# Patient Record
Sex: Male | Born: 1960 | Race: White | Hispanic: No | Marital: Married | Smoking: Never smoker
Health system: Southern US, Community
[De-identification: ages and names within clinical notes are randomized; demographics above are authoritative.]

## PROBLEM LIST (undated history)

## (undated) DIAGNOSIS — J189 Pneumonia, unspecified organism: Secondary | ICD-10-CM

## (undated) DIAGNOSIS — B508 Other severe and complicated Plasmodium falciparum malaria: Secondary | ICD-10-CM

## (undated) DIAGNOSIS — E872 Acidosis: Secondary | ICD-10-CM

## (undated) DIAGNOSIS — D696 Thrombocytopenia, unspecified: Secondary | ICD-10-CM

## (undated) DIAGNOSIS — A419 Sepsis, unspecified organism: Secondary | ICD-10-CM

---

## 2002-07-29 ENCOUNTER — Encounter: Payer: Self-pay | Admitting: Pulmonary Disease

## 2002-07-29 ENCOUNTER — Inpatient Hospital Stay (HOSPITAL_COMMUNITY): Admission: EM | Admit: 2002-07-29 | Discharge: 2002-09-24 | Payer: Self-pay | Admitting: Pulmonary Disease

## 2002-07-30 ENCOUNTER — Encounter: Payer: Self-pay | Admitting: Pulmonary Disease

## 2002-07-31 ENCOUNTER — Encounter: Payer: Self-pay | Admitting: Thoracic Surgery

## 2002-07-31 ENCOUNTER — Encounter: Payer: Self-pay | Admitting: Pulmonary Disease

## 2002-08-01 ENCOUNTER — Encounter: Payer: Self-pay | Admitting: Pulmonary Disease

## 2002-08-02 ENCOUNTER — Encounter: Payer: Self-pay | Admitting: Pulmonary Disease

## 2002-08-03 ENCOUNTER — Encounter: Payer: Self-pay | Admitting: Pulmonary Disease

## 2002-08-04 ENCOUNTER — Encounter: Payer: Self-pay | Admitting: Thoracic Surgery

## 2002-08-05 ENCOUNTER — Encounter: Payer: Self-pay | Admitting: Pulmonary Disease

## 2002-08-05 ENCOUNTER — Encounter: Payer: Self-pay | Admitting: Critical Care Medicine

## 2002-08-06 ENCOUNTER — Encounter: Payer: Self-pay | Admitting: Critical Care Medicine

## 2002-08-07 ENCOUNTER — Encounter: Payer: Self-pay | Admitting: Critical Care Medicine

## 2002-08-07 ENCOUNTER — Encounter: Payer: Self-pay | Admitting: Pulmonary Disease

## 2002-08-08 ENCOUNTER — Encounter: Payer: Self-pay | Admitting: Critical Care Medicine

## 2002-08-08 ENCOUNTER — Encounter: Payer: Self-pay | Admitting: Pulmonary Disease

## 2002-08-09 ENCOUNTER — Encounter: Payer: Self-pay | Admitting: Pulmonary Disease

## 2002-08-10 ENCOUNTER — Encounter: Payer: Self-pay | Admitting: Pulmonary Disease

## 2002-08-12 ENCOUNTER — Encounter: Payer: Self-pay | Admitting: Critical Care Medicine

## 2002-08-12 ENCOUNTER — Encounter: Payer: Self-pay | Admitting: Pulmonary Disease

## 2002-08-12 ENCOUNTER — Encounter: Payer: Self-pay | Admitting: Thoracic Surgery

## 2002-08-13 ENCOUNTER — Encounter: Payer: Self-pay | Admitting: Thoracic Surgery

## 2002-08-14 ENCOUNTER — Encounter: Payer: Self-pay | Admitting: Pulmonary Disease

## 2002-08-14 ENCOUNTER — Encounter: Payer: Self-pay | Admitting: Thoracic Surgery

## 2002-08-15 ENCOUNTER — Encounter: Payer: Self-pay | Admitting: Thoracic Surgery

## 2002-08-16 ENCOUNTER — Encounter: Payer: Self-pay | Admitting: Pulmonary Disease

## 2002-08-17 ENCOUNTER — Encounter: Payer: Self-pay | Admitting: Pulmonary Disease

## 2002-08-18 ENCOUNTER — Encounter: Payer: Self-pay | Admitting: Thoracic Surgery

## 2002-08-19 ENCOUNTER — Encounter: Payer: Self-pay | Admitting: Critical Care Medicine

## 2002-08-20 ENCOUNTER — Encounter: Payer: Self-pay | Admitting: Nephrology

## 2002-08-20 ENCOUNTER — Encounter: Payer: Self-pay | Admitting: Pulmonary Disease

## 2002-08-21 ENCOUNTER — Encounter: Payer: Self-pay | Admitting: Pulmonary Disease

## 2002-08-21 ENCOUNTER — Encounter: Payer: Self-pay | Admitting: Infectious Diseases

## 2002-08-22 ENCOUNTER — Encounter: Payer: Self-pay | Admitting: Pulmonary Disease

## 2002-08-23 ENCOUNTER — Encounter: Payer: Self-pay | Admitting: Pulmonary Disease

## 2002-08-24 ENCOUNTER — Encounter: Payer: Self-pay | Admitting: Pulmonary Disease

## 2002-08-25 ENCOUNTER — Encounter: Payer: Self-pay | Admitting: Thoracic Surgery

## 2002-08-26 ENCOUNTER — Encounter: Payer: Self-pay | Admitting: Nephrology

## 2002-08-26 ENCOUNTER — Encounter: Payer: Self-pay | Admitting: Pulmonary Disease

## 2002-08-27 ENCOUNTER — Encounter: Payer: Self-pay | Admitting: Pulmonary Disease

## 2002-08-28 ENCOUNTER — Encounter: Payer: Self-pay | Admitting: Pulmonary Disease

## 2002-08-29 ENCOUNTER — Encounter: Payer: Self-pay | Admitting: Pulmonary Disease

## 2002-08-30 ENCOUNTER — Encounter: Payer: Self-pay | Admitting: Pulmonary Disease

## 2002-08-31 ENCOUNTER — Encounter: Payer: Self-pay | Admitting: Thoracic Surgery

## 2002-09-01 ENCOUNTER — Encounter: Payer: Self-pay | Admitting: Thoracic Surgery

## 2002-09-02 ENCOUNTER — Encounter: Payer: Self-pay | Admitting: Thoracic Surgery

## 2002-09-02 ENCOUNTER — Encounter: Payer: Self-pay | Admitting: Pulmonary Disease

## 2002-09-03 ENCOUNTER — Encounter: Payer: Self-pay | Admitting: Pulmonary Disease

## 2002-09-04 ENCOUNTER — Encounter: Payer: Self-pay | Admitting: Thoracic Surgery

## 2002-09-05 ENCOUNTER — Encounter: Payer: Self-pay | Admitting: Thoracic Surgery

## 2002-09-06 ENCOUNTER — Encounter: Payer: Self-pay | Admitting: Thoracic Surgery

## 2002-09-07 ENCOUNTER — Encounter: Payer: Self-pay | Admitting: Thoracic Surgery

## 2002-09-08 ENCOUNTER — Encounter: Payer: Self-pay | Admitting: Thoracic Surgery

## 2002-09-09 ENCOUNTER — Encounter: Payer: Self-pay | Admitting: Thoracic Surgery

## 2002-09-10 ENCOUNTER — Encounter: Payer: Self-pay | Admitting: Thoracic Surgery

## 2002-09-11 ENCOUNTER — Encounter: Payer: Self-pay | Admitting: Thoracic Surgery

## 2002-09-12 ENCOUNTER — Encounter: Payer: Self-pay | Admitting: Thoracic Surgery

## 2002-09-13 ENCOUNTER — Encounter: Payer: Self-pay | Admitting: Pulmonary Disease

## 2002-09-13 ENCOUNTER — Encounter: Payer: Self-pay | Admitting: Thoracic Surgery

## 2002-09-14 ENCOUNTER — Encounter: Payer: Self-pay | Admitting: Thoracic Surgery

## 2002-09-15 ENCOUNTER — Encounter: Payer: Self-pay | Admitting: Thoracic Surgery

## 2002-09-16 ENCOUNTER — Encounter: Payer: Self-pay | Admitting: Thoracic Surgery

## 2002-09-17 ENCOUNTER — Encounter: Payer: Self-pay | Admitting: Thoracic Surgery

## 2002-09-19 ENCOUNTER — Encounter: Payer: Self-pay | Admitting: Thoracic Surgery

## 2002-09-20 ENCOUNTER — Encounter: Payer: Self-pay | Admitting: Thoracic Surgery

## 2002-09-22 ENCOUNTER — Encounter: Payer: Self-pay | Admitting: Pulmonary Disease

## 2002-09-23 ENCOUNTER — Encounter: Payer: Self-pay | Admitting: Thoracic Surgery

## 2002-09-24 ENCOUNTER — Encounter: Payer: Self-pay | Admitting: Thoracic Surgery

## 2002-10-01 ENCOUNTER — Encounter: Payer: Self-pay | Admitting: Thoracic Surgery

## 2002-10-01 ENCOUNTER — Encounter: Admission: RE | Admit: 2002-10-01 | Discharge: 2002-10-01 | Payer: Self-pay | Admitting: Thoracic Surgery

## 2002-10-18 ENCOUNTER — Encounter: Admission: RE | Admit: 2002-10-18 | Discharge: 2002-10-18 | Payer: Self-pay | Admitting: Thoracic Surgery

## 2002-10-18 ENCOUNTER — Encounter: Payer: Self-pay | Admitting: Thoracic Surgery

## 2002-11-29 ENCOUNTER — Encounter: Payer: Self-pay | Admitting: Thoracic Surgery

## 2002-11-29 ENCOUNTER — Encounter: Admission: RE | Admit: 2002-11-29 | Discharge: 2002-11-29 | Payer: Self-pay | Admitting: Thoracic Surgery

## 2003-04-04 ENCOUNTER — Encounter: Admission: RE | Admit: 2003-04-04 | Discharge: 2003-04-04 | Payer: Self-pay | Admitting: Thoracic Surgery

## 2003-04-04 ENCOUNTER — Encounter: Payer: Self-pay | Admitting: Thoracic Surgery

## 2004-02-02 IMAGING — CT CT HEAD W/O CM
1 series · 16 of 28 positions shown, 20 images · non-contrast
Comparison: none

FINDINGS
CLINICAL DATA: FOLLOW-UP FRONTAL HEMORRHAGE.
CT BRAIN WITHOUT CONTRAST
COMPARISON CT 08/09/02.
THERE IS A HYPERDENSITY IN THE RIGHT ANTERIOR FRONTAL LOBE SIMILAR TO THE PRIOR STUDY.  THERE IS
SURROUNDING LOW DENSITY EDEMA.  THIS IS MOST COMPATIBLE WITH A HEMORRHAGIC CONTUSION.  QUESTION
HISTORY OF TRAUMA.  THERE MAY ALSO BE A SMALL AREA OF HEMORRHAGIC CONTUSION IN THE LEFT FRONTAL
LOBE.  NO OTHER AREAS OF HEMORRHAGE ARE SEEN.  THE VENTRICLES ARE NOT ENLARGED.
IMPRESSION
NO SIGNIFICANT CHANGE RIGHT FRONTAL HEMORRHAGE.  THERE MAY ALSO BE A SMALL AREA OF HEMORRHAGE IN
THE LEFT FRONTAL LOBE.  THE APPEARANCE IS MOST COMPATIBLE WITH HEMORRHAGIC CONTUSIONS, QUESTION
TRAUMA.

[Series 2: head w/o · axial · non-contrast · 0.47mm/px · z∈[+140,+265]mm · 16 of 28 slices shown, 20 images]
[im 2/28  brain]
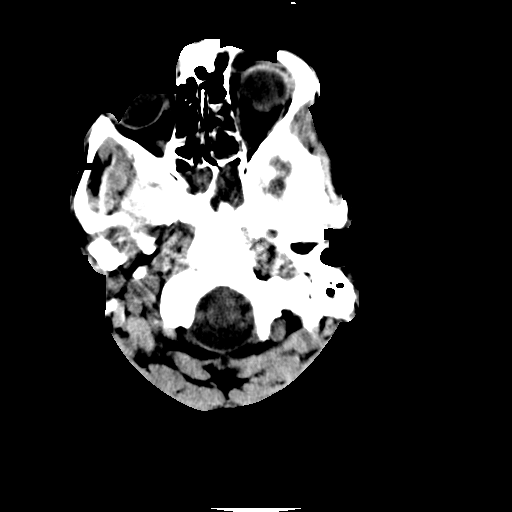
[im 2/28  bone]
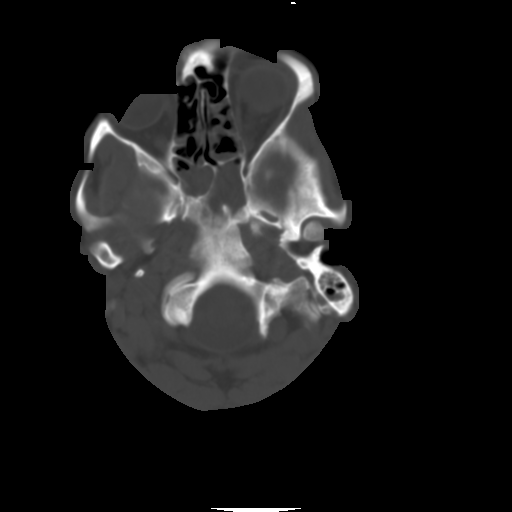
[im 4/28  brain]
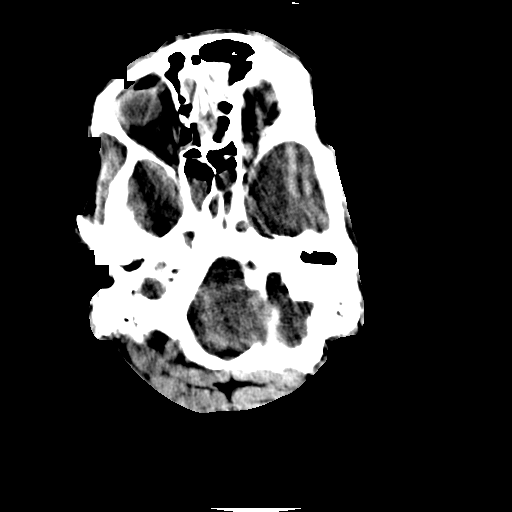
[im 6/28  brain]
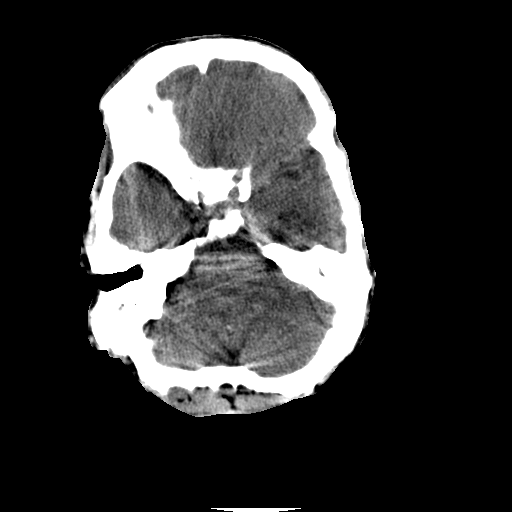
[im 7/28  brain]
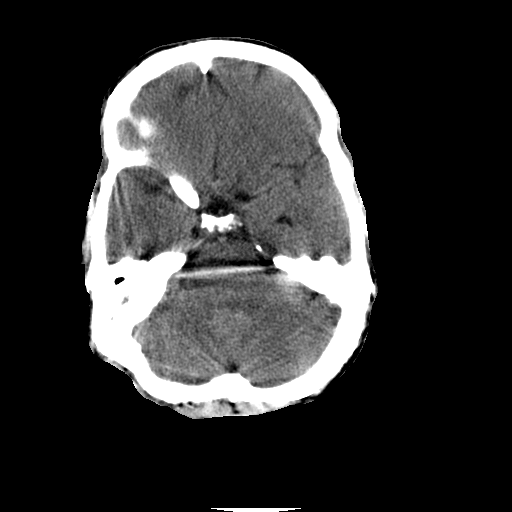
[im 9/28  brain]
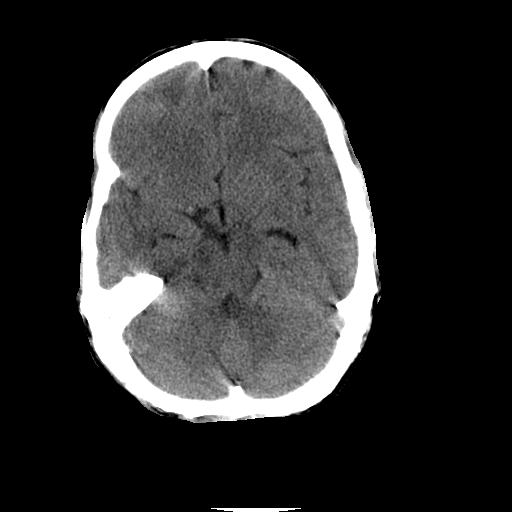
[im 9/28  bone]
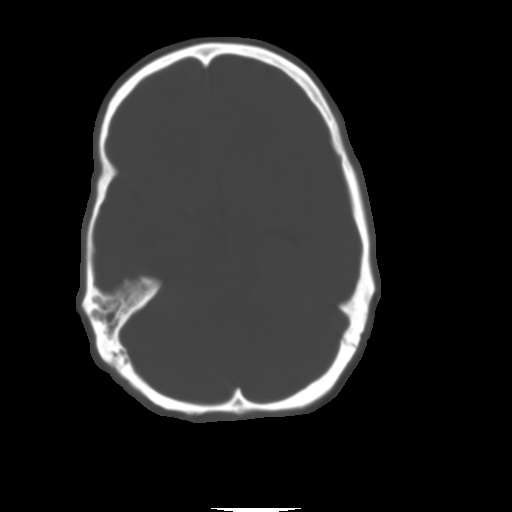
[im 10/28  brain]
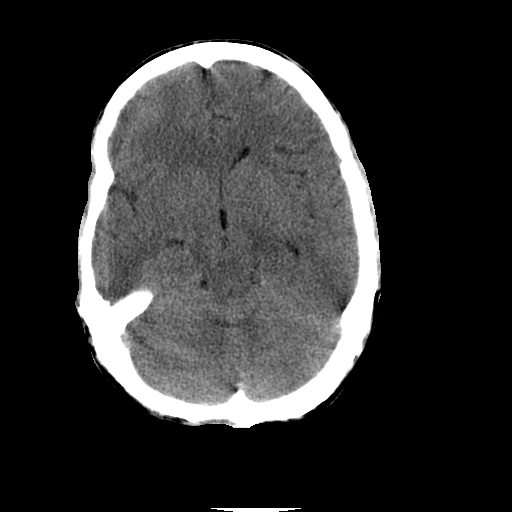
[im 12/28  brain]
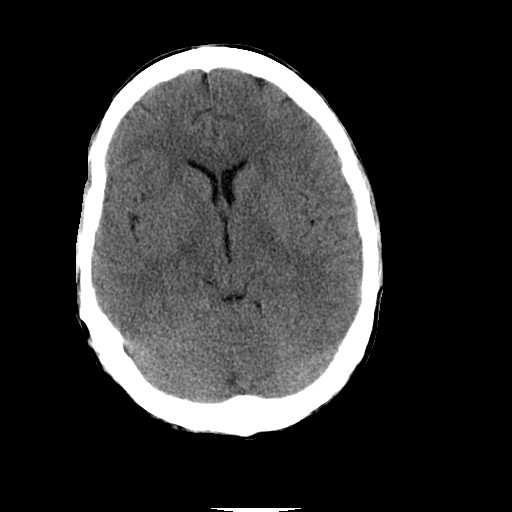
[im 14/28  brain]
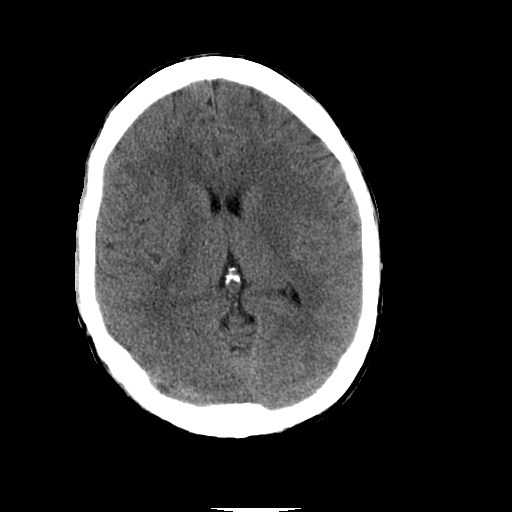
[im 15/28  brain]
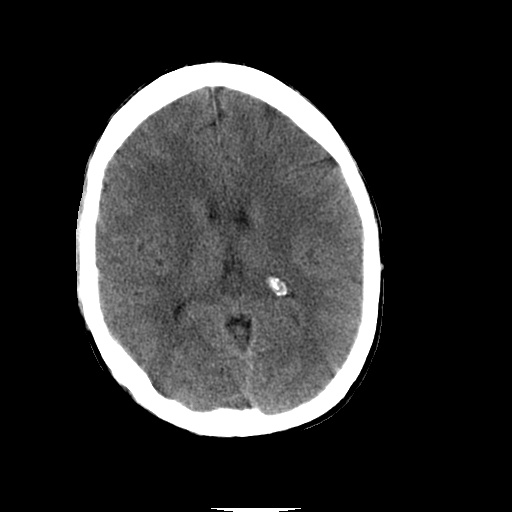
[im 15/28  bone]
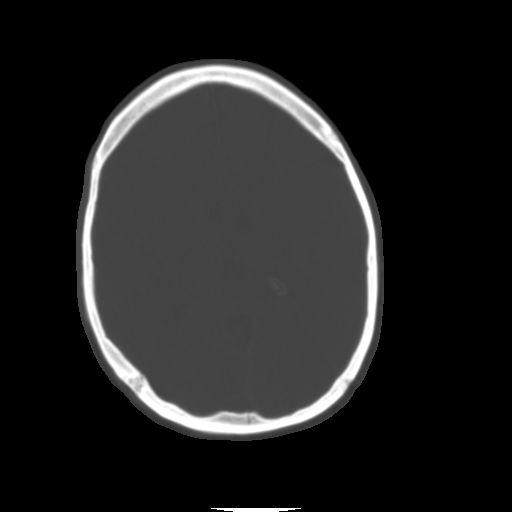
[im 17/28  brain]
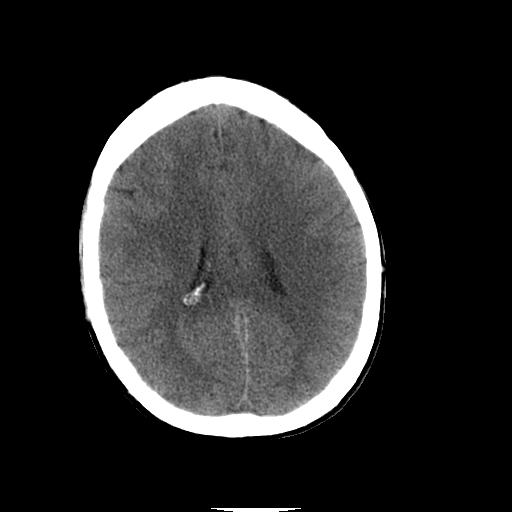
[im 19/28  brain]
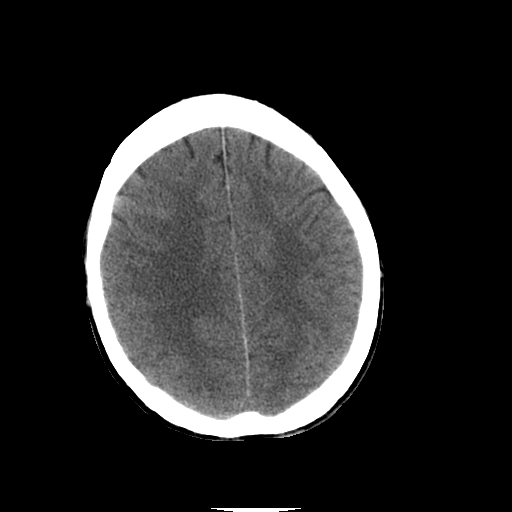
[im 20/28  brain]
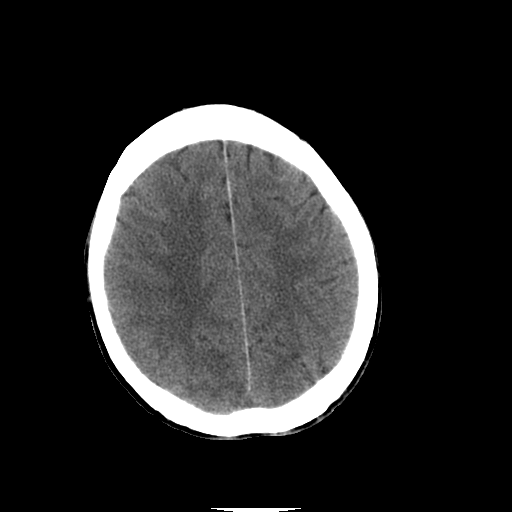
[im 22/28  brain]
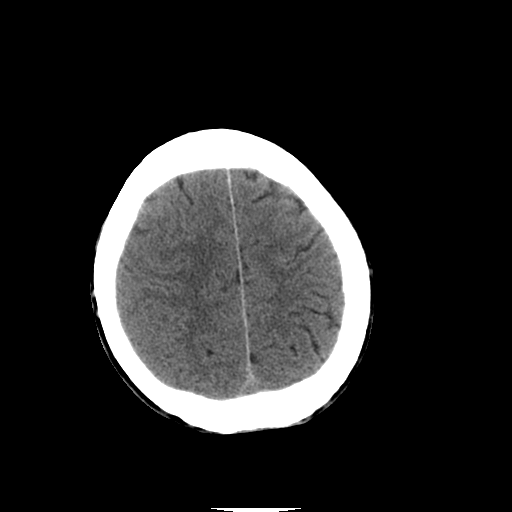
[im 22/28  bone]
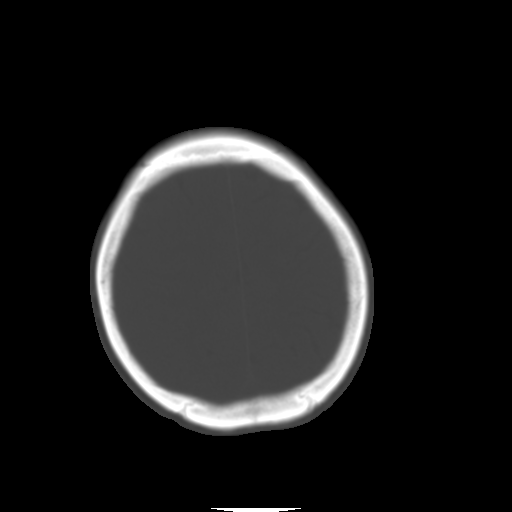
[im 23/28  brain]
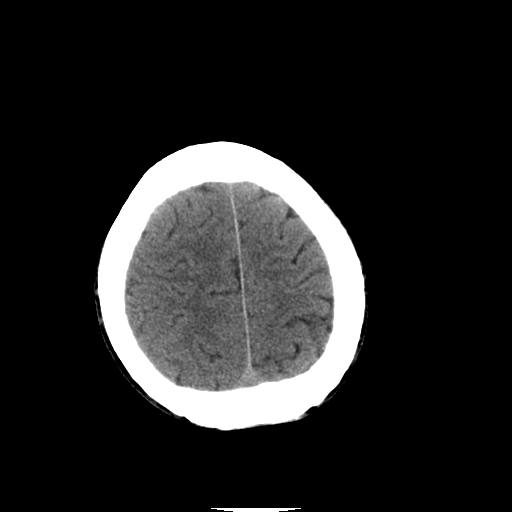
[im 25/28  brain]
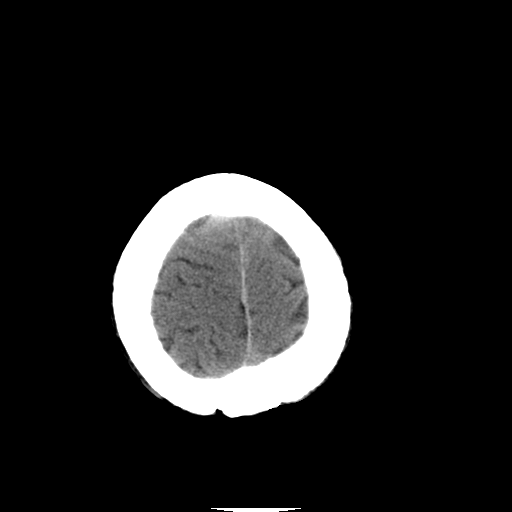
[im 27/28  brain]
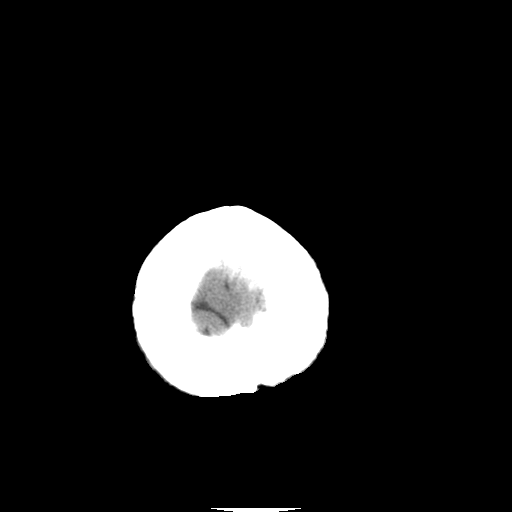

[16 of 28 positions shown; findings below may reference images not displayed]

## 2016-03-18 ENCOUNTER — Emergency Department (HOSPITAL_COMMUNITY): Payer: BC Managed Care – PPO

## 2016-03-18 ENCOUNTER — Inpatient Hospital Stay (HOSPITAL_COMMUNITY)
Admission: EM | Admit: 2016-03-18 | Discharge: 2016-03-24 | DRG: 868 | Disposition: A | Discharge status: Home / Self Care | Payer: BC Managed Care – PPO | Attending: Internal Medicine | Admitting: Internal Medicine

## 2016-03-18 ENCOUNTER — Encounter (HOSPITAL_COMMUNITY): Payer: Self-pay | Admitting: *Deleted

## 2016-03-18 DIAGNOSIS — G94 Other disorders of brain in diseases classified elsewhere: Secondary | ICD-10-CM

## 2016-03-18 DIAGNOSIS — B54 Unspecified malaria: Secondary | ICD-10-CM | POA: Diagnosis present

## 2016-03-18 DIAGNOSIS — R7402 Elevation of levels of lactic acid dehydrogenase (LDH): Secondary | ICD-10-CM | POA: Diagnosis present

## 2016-03-18 DIAGNOSIS — R4701 Aphasia: Secondary | ICD-10-CM | POA: Diagnosis present

## 2016-03-18 DIAGNOSIS — R945 Abnormal results of liver function studies: Secondary | ICD-10-CM

## 2016-03-18 DIAGNOSIS — W19XXXA Unspecified fall, initial encounter: Secondary | ICD-10-CM | POA: Diagnosis present

## 2016-03-18 DIAGNOSIS — R17 Unspecified jaundice: Secondary | ICD-10-CM

## 2016-03-18 DIAGNOSIS — E876 Hypokalemia: Secondary | ICD-10-CM | POA: Diagnosis present

## 2016-03-18 DIAGNOSIS — R7989 Other specified abnormal findings of blood chemistry: Secondary | ICD-10-CM

## 2016-03-18 DIAGNOSIS — E872 Acidosis, unspecified: Secondary | ICD-10-CM

## 2016-03-18 DIAGNOSIS — B509 Plasmodium falciparum malaria, unspecified: Principal | ICD-10-CM | POA: Diagnosis present

## 2016-03-18 DIAGNOSIS — G9340 Encephalopathy, unspecified: Secondary | ICD-10-CM | POA: Diagnosis not present

## 2016-03-18 DIAGNOSIS — R269 Unspecified abnormalities of gait and mobility: Secondary | ICD-10-CM | POA: Diagnosis present

## 2016-03-18 DIAGNOSIS — Y92009 Unspecified place in unspecified non-institutional (private) residence as the place of occurrence of the external cause: Secondary | ICD-10-CM

## 2016-03-18 DIAGNOSIS — R740 Nonspecific elevation of levels of transaminase and lactic acid dehydrogenase [LDH]: Secondary | ICD-10-CM | POA: Diagnosis not present

## 2016-03-18 DIAGNOSIS — D696 Thrombocytopenia, unspecified: Secondary | ICD-10-CM | POA: Diagnosis present

## 2016-03-18 DIAGNOSIS — R739 Hyperglycemia, unspecified: Secondary | ICD-10-CM | POA: Diagnosis present

## 2016-03-18 DIAGNOSIS — R7401 Elevation of levels of liver transaminase levels: Secondary | ICD-10-CM

## 2016-03-18 DIAGNOSIS — B508 Other severe and complicated Plasmodium falciparum malaria: Secondary | ICD-10-CM | POA: Diagnosis not present

## 2016-03-18 DIAGNOSIS — Z789 Other specified health status: Secondary | ICD-10-CM

## 2016-03-18 DIAGNOSIS — B500 Plasmodium falciparum malaria with cerebral complications: Secondary | ICD-10-CM | POA: Diagnosis not present

## 2016-03-18 DIAGNOSIS — E860 Dehydration: Secondary | ICD-10-CM

## 2016-03-18 DIAGNOSIS — N179 Acute kidney failure, unspecified: Secondary | ICD-10-CM | POA: Diagnosis present

## 2016-03-18 HISTORY — DX: Thrombocytopenia, unspecified: D69.6

## 2016-03-18 HISTORY — DX: Acidosis: E87.2

## 2016-03-18 HISTORY — DX: Other severe and complicated Plasmodium falciparum malaria: B50.8

## 2016-03-18 HISTORY — DX: Sepsis, unspecified organism: A41.9

## 2016-03-18 HISTORY — DX: Pneumonia, unspecified organism: J18.9

## 2016-03-18 HISTORY — DX: Acidosis, unspecified: E87.20

## 2016-03-18 LAB — I-STAT CG4 LACTIC ACID, ED
Lactic Acid, Venous: 3.59 mmol/L (ref 0.5–1.9)
Lactic Acid, Venous: 3.36 mmol/L (ref 0.5–1.9)

## 2016-03-18 LAB — COMPREHENSIVE METABOLIC PANEL
ALK PHOS: 62 U/L (ref 38–126)
ALT: 361 U/L — AB (ref 17–63)
ANION GAP: 10 (ref 5–15)
AST: 203 U/L — ABNORMAL HIGH (ref 15–41)
Albumin: 2.9 g/dL — ABNORMAL LOW (ref 3.5–5.0)
BILIRUBIN TOTAL: 6.3 mg/dL — AB (ref 0.3–1.2)
BUN: 24 mg/dL — ABNORMAL HIGH (ref 6–20)
CALCIUM: 8.2 mg/dL — AB (ref 8.9–10.3)
CO2: 27 mmol/L (ref 22–32)
CREATININE: 1.36 mg/dL — AB (ref 0.61–1.24)
Chloride: 99 mmol/L — ABNORMAL LOW (ref 101–111)
GFR, EST NON AFRICAN AMERICAN: 57 mL/min — AB (ref >60)
Glucose, Bld: 124 mg/dL — ABNORMAL HIGH (ref 65–99)
Potassium: 3.4 mmol/L — ABNORMAL LOW (ref 3.5–5.1)
SODIUM: 136 mmol/L (ref 135–145)
TOTAL PROTEIN: 5.2 g/dL — AB (ref 6.5–8.1)

## 2016-03-18 LAB — TYPE AND SCREEN
ABO/RH(D): O POS
Antibody Screen: POSITIVE
DAT, IgG: NEGATIVE
PT AG Type: NEGATIVE

## 2016-03-18 LAB — CBC WITH DIFFERENTIAL/PLATELET
BASOS PCT: 1 %
Basophils Absolute: 0.1 K/uL (ref 0.0–0.1)
Basophils Absolute: 0 10*3/uL (ref 0.0–0.1)
Basophils Relative: 1 %
EOS ABS: 0.4 10*3/uL (ref 0.0–0.7)
Eosinophils Absolute: 0.2 K/uL (ref 0.0–0.7)
Eosinophils Relative: 2 %
Eosinophils Relative: 5 %
HCT: 38.4 % — ABNORMAL LOW (ref 39.0–52.0)
HEMATOCRIT: 32.6 % — AB (ref 39.0–52.0)
Hemoglobin: 13.5 g/dL (ref 13.0–17.0)
Hemoglobin: 11 g/dL — ABNORMAL LOW (ref 13.0–17.0)
LYMPHS ABS: 1.1 10*3/uL (ref 0.7–4.0)
Lymphocytes Relative: 14 %
Lymphocytes Relative: 15 %
Lymphs Abs: 1.1 K/uL (ref 0.7–4.0)
MCH: 30.3 pg (ref 26.0–34.0)
MCH: 29.3 pg (ref 26.0–34.0)
MCHC: 35.2 g/dL (ref 30.0–36.0)
MCHC: 33.7 g/dL (ref 30.0–36.0)
MCV: 86.1 fL (ref 78.0–100.0)
MCV: 86.7 fL (ref 78.0–100.0)
MONO ABS: 0.6 10*3/uL (ref 0.1–1.0)
MONOS PCT: 8 %
Monocytes Absolute: 0.6 K/uL (ref 0.1–1.0)
Monocytes Relative: 8 %
NEUTROS ABS: 5.2 10*3/uL (ref 1.7–7.7)
Neutro Abs: 6 K/uL (ref 1.7–7.7)
Neutrophils Relative %: 75 %
Neutrophils Relative %: 71 %
PLATELETS: 21 10*3/uL — AB (ref 150–400)
Platelets: 28 K/uL — CL (ref 150–400)
RBC: 4.46 MIL/uL (ref 4.22–5.81)
RBC: 3.76 MIL/uL — ABNORMAL LOW (ref 4.22–5.81)
RDW: 13.4 % (ref 11.5–15.5)
RDW: 13.6 % (ref 11.5–15.5)
WBC Morphology: INCREASED
WBC Morphology: INCREASED
WBC: 8 K/uL (ref 4.0–10.5)
WBC: 7.3 10*3/uL (ref 4.0–10.5)

## 2016-03-18 LAB — LACTIC ACID, PLASMA
LACTIC ACID, VENOUS: 3.5 mmol/L — AB (ref 0.5–1.9)
Lactic Acid, Venous: 3.6 mmol/L (ref 0.5–1.9)

## 2016-03-18 LAB — LACTATE DEHYDROGENASE: LDH: 515 U/L — AB (ref 98–192)

## 2016-03-18 LAB — MRSA PCR SCREENING: MRSA by PCR: NEGATIVE

## 2016-03-18 LAB — PARASITE EXAM SCREEN, BLOOD-W CONF TO LABCORP (NOT @ ARMC)

## 2016-03-18 MED ORDER — SODIUM CHLORIDE 0.9 % IV SOLN
24.0000 mg/kg | INTRAVENOUS | Status: AC
  Administered 2016-03-18: 1632 mg via INTRAVENOUS
  Filled 2016-03-18: qty 20.4

## 2016-03-18 MED ORDER — SODIUM CHLORIDE 0.9 % IV BOLUS (SEPSIS)
1000.0000 mL | Freq: Once | INTRAVENOUS | Status: AC
  Administered 2016-03-18: 1000 mL via INTRAVENOUS

## 2016-03-18 MED ORDER — ONDANSETRON HCL 4 MG/2ML IJ SOLN
4.0000 mg | Freq: Three times a day (TID) | INTRAMUSCULAR | Status: DC | PRN
  Administered 2016-03-18: 4 mg via INTRAVENOUS
  Filled 2016-03-18: qty 2

## 2016-03-18 MED ORDER — SODIUM CHLORIDE 0.9 % IV SOLN: Freq: Once | INTRAVENOUS | Status: DC

## 2016-03-18 MED ORDER — POTASSIUM CHLORIDE CRYS ER 20 MEQ PO TBCR
40.0000 meq | EXTENDED_RELEASE_TABLET | Freq: Once | ORAL | Status: AC
  Administered 2016-03-18: 40 meq via ORAL
  Filled 2016-03-18: qty 2

## 2016-03-18 MED ORDER — SODIUM CHLORIDE 0.9 % IV SOLN
12.0000 mg/kg | Freq: Three times a day (TID) | INTRAVENOUS | Status: DC
  Administered 2016-03-18 – 2016-03-20 (×5): 816 mg via INTRAVENOUS
  Filled 2016-03-18 (×10): qty 10.2

## 2016-03-18 MED ORDER — DOXYCYCLINE HYCLATE 100 MG IV SOLR
100.0000 mg | Freq: Once | INTRAVENOUS | Status: AC
  Administered 2016-03-18: 100 mg via INTRAVENOUS
  Filled 2016-03-18: qty 100

## 2016-03-18 MED ORDER — KCL IN DEXTROSE-NACL 20-5-0.45 MEQ/L-%-% IV SOLN
Freq: Once | INTRAVENOUS | Status: AC
  Administered 2016-03-18: 18:00:00 via INTRAVENOUS
  Filled 2016-03-18: qty 1000

## 2016-03-18 NOTE — Consult Note (Signed)
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 Ellender Hose   HPI:  Scott Leonard is an 55 y.o. male with history of prior severe malaria and lung abscess requiring lobectomy by Dr. Arlyce Dice for admission trip in the last few weeks. He states that he did not take malaria prophylaxis and had not done so on any of his prior trips to [place]. He also had been drinking less fluids this time other than feeling slightly dehydrated he felt in his usual state of health until the very end of his trip when he began to have some malaise and anorexia. Apparently one of the clinicians gave him what appears to be in antiparasitic drug related to metronidazole and told him to take this "as needed. He took 1 pill of this before getting on his flight back to the [place]. He continues up with malaise and fatigue and developed fever that then went away he then began to feel better again and then had recurrence of his malaise and profound fatigue and weakness. He would see his primary care physician who drew CBC and a chemistry panel. When the panel came back with liver function tests that were abnormal he was brought back and liver function tests were checked along with a malaria smear. Apparently when the patient was seen by the primary care physician the patient himself thought that he might be suffering from "food poisoning because he had eaten some eggs salads and been feeling poorly afterwards. Note he had not had any vomiting or diarrhea and cyst or was really not all consistent with a foodborne infection.  Yesterday a malaria smear was done and was positive for Plasmodium falciparum. Percent. Her CT me a was 26%.  After feeling better yesterday he slept profoundly last night and was difficult to arouse this morning when he got up to walk he fell down. He was brought to the  emergency department by his family and the diagnosis of malaria conveyed to ED staff. In the ER he was found to have acute kidney injury and lactic acidosis along with severe thrombus cytopenia.  The patient's family had noted him to be having episodes of confusion in the last 24 hours at times forgetting things are commenting about events that did not occur. The patient himself does not seem very cognizant of this but his wife and daughter point out that he clearly was confused at times. During my interview with the patient he was alert and oriented and discussing his trip but was being corrected about information such as the number of people that went with him on his trip he was saying they were 49 where his his wife said no that were 9 dont you re- member?   Past Medical History:  Diagnosis Date  . Pneumonia   . Sepsis ([facility])     History reviewed. No pertinent surgical history.  Social History:  reports that he has never smoked. He has never used smokeless tobacco. He reports that he does not drink alcohol or use drugs.   History reviewed. No pertinent family history.  No Known Allergies   Medications: I have reviewed patients current medications as documented in Epic Anti-infectives    Start     Dose/Rate Route Frequency Ordered Stop   03/18/16 1500  doxycycline (VIBRAMYCIN) 100 mg in dextrose 5 % 250 mL IVPB  100 mg 125 mL/hr over 120 Minutes Intravenous  Once 03/18/16 1430           ROS:  as in HPI otherwise remainder of 12 point Review of Systems is negative    Blood pressure 104/66, pulse (!) 59, temperature 97.8 F (36.6 C), temperature source Oral, resp. rate 16, weight 150 lb (68 kg), SpO2 98 %. General: Alert and awake, oriented x3, not in any acute distress. HEENT: anicteric sclera,  EOMI, oropharynx clear and without exudate Cardiovascular: Bradycardic, normal r,  no murmur rubs or gallops Pulmonary: clear to auscultation bilaterally, no wheezing, rales or  rhonchi Gastrointestinal: soft nontender, nondistended, normal bowel sounds, Musculoskeletal: no   edema noted bilaterally Skin, soft tissue: no rashes Neuro: nonfocal, strength and sensation intact   Results for orders placed or performed during the hospital encounter of 03/18/16 (from the past 48 hour(s))  Type and screen [place]     Status: None   Collection Time: 03/18/16 12:44 PM  Result Value Ref Range   ABO/RH(D) O POS    Antibody Screen POS    Sample Expiration 03/21/2016    DAT, IgG NEG    Antibody Identification ANTI K    PT AG Type NEGATIVE FOR KELL ANTIGEN   CBC with Differential     Status: Abnormal   Collection Time: 03/18/16 12:46 PM  Result Value Ref Range   WBC 8.0 4.0 - 10.5 K/uL   RBC 4.46 4.22 - 5.81 MIL/uL   Hemoglobin 13.5 13.0 - 17.0 g/dL   HCT 38.4 (L) 39.0 - 52.0 %   MCV 86.1 78.0 - 100.0 fL   MCH 30.3 26.0 - 34.0 pg   MCHC 35.2 30.0 - 36.0 g/dL   RDW 13.4 11.5 - 15.5 %   Platelets 28 (LL) 150 - 400 K/uL    Comment: PLATELET COUNT CONFIRMED BY SMEAR CRITICAL RESULT CALLED TO, READ BACK BY AND VERIFIED WITH: ELIZABETH POULOSE,RN AT 1416 03/18/16 BY ZBEECH.    Neutrophils Relative % 75 %   Lymphocytes Relative 14 %   Monocytes Relative 8 %   Eosinophils Relative 2 %   Basophils Relative 1 %   Neutro Abs 6.0 1.7 - 7.7 K/uL   Lymphs Abs 1.1 0.7 - 4.0 K/uL   Monocytes Absolute 0.6 0.1 - 1.0 K/uL   Eosinophils Absolute 0.2 0.0 - 0.7 K/uL   Basophils Absolute 0.1 0.0 - 0.1 K/uL   RBC Morphology BURR CELLS     Comment: MALARIA NOTED ON SMEAR READ BACK AND VERIFIED WITH ELIZABETH POULOSE,RN AT [place].    WBC Morphology INCREASED BANDS (>20% BANDS)     Comment: ATYPICAL LYMPHOCYTES  Comprehensive metabolic panel     Status: Abnormal   Collection Time: 03/18/16 12:46 PM  Result Value Ref Range   Sodium 136 135 - 145 mmol/L   Potassium 3.4 (L) 3.5 - 5.1 mmol/L   Chloride 99 (L) 101 - 111 mmol/L   CO2 27 22 - 32 mmol/L    Glucose, Bld 124 (H) 65 - 99 mg/dL   BUN 24 (H) 6 - 20 mg/dL   Creatinine, Ser 1.36 (H) 0.61 - 1.24 mg/dL   Calcium 8.2 (L) 8.9 - 10.3 mg/dL   Total Protein 5.2 (L) 6.5 - 8.1 g/dL   Albumin 2.9 (L) 3.5 - 5.0 g/dL   AST 203 (H) 15 - 41 U/L   ALT 361 (H) 17 - 63 U/L   Alkaline Phosphatase 62 38 - 126 U/L  Total Bilirubin 6.3 (H) 0.3 - 1.2 mg/dL   GFR calc non Af Amer 57 (L) >60 mL/min   GFR calc Af Amer >60 >60 mL/min    Comment: (NOTE) The eGFR has been calculated using the CKD EPI equation. This calculation has not been validated in all clinical situations. eGFR's persistently <60 mL/min signify possible Chronic Kidney Disease.    Anion gap 10 5 - 15  Lactate dehydrogenase     Status: Abnormal   Collection Time: 03/18/16 12:59 PM  Result Value Ref Range   LDH 515 (H) 98 - 192 U/L  I-Stat CG4 Lactic Acid, ED     Status: Abnormal   Collection Time: 03/18/16  1:13 PM  Result Value Ref Range   Lactic Acid, Venous 3.59 (HH) 0.5 - 1.9 mmol/L   Comment NOTIFIED PHYSICIAN   Parasite Exam Screen, Blood-w conf to [facility] (Not @ [facility])     Status: None   Collection Time: 03/18/16  1:48 PM  Result Value Ref Range   Parasite Exam Screen, Blood            Comment: Plasmodium or other Blood Parasites seen on thin smears. Sent to Reference Laboratory for identification and speciation. CRITICAL RESULT CALLED TO, READ BACK BY AND VERIFIED WITH: E.POULOUS,RN 03/21/16 _0  BY V.WILKINS   I-Stat CG4 Lactic Acid, ED     Status: Abnormal   Collection Time: 03/18/16  4:02 PM  Result Value Ref Range   Lactic Acid, Venous 3.36 (HH) 0.5 - 1.9 mmol/L   Comment NOTIFIED PHYSICIAN    _1 (sdes,specrequest,cult,reptstatus)   )No results found for this or any previous visit (from the past 720 hour(s)).   Impression/Recommendation  Active Problems:   Malaria   MILAM ALLBAUGH is a 55 y.o. male with severe falciparum malaria:  #1 Severe Plasmodium falciparum infection: percent  parasitemia yesterday alone puts him in this category. I am more disturbed than anything by his altered consciousness at times. While these changes might be subtle now they could be a harbinger of impending catastrophic CNS involvement  I discussed case with CDC Malaria team in part because I wanted to see if we could obtain IV artusunate   They were disturbed by his level of parasitemia yesterday and his neuro status  They did not find indication for IV artusunate but recommended IV quinidine and doxycycline which we had ordered  EKG prior to starting therapy with normal QTC and breast T-wave is an the inferior leads  He should have D5 in his maintenance fluid to help prevent hypoglycemia and be monitored closely on telemetry  I ordered a new parasite screen and over this will calm with a percent per CT me at today's it will have a new baseline we'll recheck a smear with percent parasitemia in the morning.  Malaria hotline is [phone]   For now will plan on likely 3 days of IV quinidine and 7 days of doxycyline  Supportive care  Neuro checks   Tele  CDC agreed that he should go to ICU for frequent Neuro checks and close monitoring  #2 Screening; check HIV with am labs.  I spent greater than 80 minutes with the patient including greater than 50% of time in face to face counsel of the patient and his wife and in coordination of their care with ED, CDC and Critical Care    Dr. Megan Salon will check on the patient this weekend.   03/18/2016, 5:09 PM   Thank you so much for this  interesting consult  [place] for [place] [phone] (pager) [phone] (office) 03/18/2016, 5:09 PM  Rhina [place] 03/18/2016, 5:09 PM

## 2016-03-18 NOTE — ED Notes (Signed)
Critical care at bedside  

## 2016-03-18 NOTE — ED Triage Notes (Signed)
Pt just returned home from [place] c/o weakness and no appetite; pt had fever this am and diagnosed with malaria

## 2016-03-18 NOTE — ED Notes (Signed)
Pt to ct 

## 2016-03-18 NOTE — ED Provider Notes (Signed)
[facility]-EMER[facility]ENCY DEPT Provider Note   CSN: [id number] Arrival date & time: 03/18/16  1206  First Provider Contact:  First MD Initiated Contact with Patient 03/18/16 1252        History   Chief Complaint Chief Complaint  Patient presents with  . Other    Malaria     HPI Scott Leonard is a 55 y.o. male.  55 yo M who presents from PCP's office due to concern for malaria. Pt just returned from [place] on Monday. He was there for one month. Two days prior to leaving, pt developed generalized fatigue, weakness, and subjective fevers. He returned home and presented to his PCP. At his PCP visit, he was HDS and was advised hydration. Lab work was sent and was remarkable for positive plasmodium falciparum on smear with 24% parisitosis. Pt was sent here for evaluation. Of note, he endorses and family/friend report increasing confusion, gait instability for last 24 hr. Mild confusion. No focal weakness or numbness. He was febrile yesterday. No vomiting or abdominal pain.   The history is provided by the patient and medical records.    Past Medical History:  Diagnosis Date  . Lactic acidosis 03/18/2016  . Other severe and complicated Plasmodium falciparum malaria 03/18/2016  . Pneumonia   . Sepsis (HCC)   . Thrombocytopenia ([facility]) 03/18/2016    Patient Active Problem List   Diagnosis Date Noted  . Malaria 03/18/2016  . Other severe and complicated Plasmodium falciparum malaria 03/18/2016  . Lactic acidosis 03/18/2016  . Thrombocytopenia (HCC) 03/18/2016    History reviewed. No pertinent surgical history.     Home Medications    Prior to Admission medications   Not on File    Family History History reviewed. No pertinent family history.  Social History Social History  Substance Use Topics  . Smoking status: Never Smoker  . Smokeless tobacco: Never Used  . Alcohol use No     Allergies   Review of patient's allergies indicates no known allergies.   Review of  Systems Review of Systems  Constitutional: Positive for fatigue. Negative for chills and fever.  HENT: Negative for congestion and rhinorrhea.   Eyes: Negative for visual disturbance.  Respiratory: Negative for cough, shortness of breath and wheezing.   Cardiovascular: Negative for chest pain and leg swelling.  Gastrointestinal: Negative for abdominal pain, diarrhea, nausea and vomiting.  Genitourinary: Negative for dysuria and flank pain.  Musculoskeletal: Positive for gait problem. Negative for neck pain and neck stiffness.  Skin: Negative for rash and wound.  Allergic/Immunologic: Negative for immunocompromised state.  Neurological: Positive for headaches. Negative for syncope and weakness.  Psychiatric/Behavioral: Positive for confusion.     Physical Exam Updated Vital Signs BP 98/65   Pulse (!) 56   Temp 98.2 F (36.8 C) (Oral)   Resp 18   Wt 150 lb (68 kg)   SpO2 98%   Physical Exam  Constitutional: He appears well-developed.  Non-toxic but mildly ill-appearing  HENT:  Head: Normocephalic.  Mouth/Throat: No oropharyngeal exudate.  Markedly dry mucous membranes  Eyes: Conjunctivae are normal. Pupils are equal, round, and reactive to light. Scleral icterus is present.  Neck: Neck supple.  Cardiovascular: Normal rate, regular rhythm and normal heart sounds.  Exam reveals no friction rub.   No murmur heard. Pulmonary/Chest: Effort normal. No respiratory distress. He has no wheezes. He has no rales.  Abdominal: Soft. He exhibits no distension. There is no tenderness.  Musculoskeletal: He exhibits no edema.  Skin: Skin  is warm. Capillary refill takes less than 2 seconds.  Nursing note and vitals reviewed.   Neurological Exam:  Mental Status: Alert and oriented to person, place, and time. Attention and concentration normal. Speech clear. Recent memory is intact. Cranial Nerves: Visual fields intact to confrontation in all quadrants bilaterally. EOMI and PERRLA. No  nystagmus noted. Facial sensation intact at forehead, maxillary cheek, and chin/mandible bilaterally. No weakness of masticatory muscles. No facial asymmetry or weakness. Hearing grossly normal to finer rub. Uvula is midline, and palate elevates symmetrically. Normal SCM and trapezius strength. Tongue midline without fasciculations Motor: Muscle strength 5/5 in proximal and distal UE and LE bilaterally. No pronator drift. Muscle tone normal. Reflexes: 2+ and symmetrical in all four extremities.  Sensation: Intact to light touch in upper and lower extremities distally bilaterally.  Gait: Normal without ataxia. Coordination: Normal FTN bilaterally.     ED Treatments / Results  Labs (all labs ordered are listed, but only abnormal results are displayed) Labs Reviewed  CBC WITH DIFFERENTIAL/PLATELET - Abnormal; Notable for the following:       Result Value   HCT 38.4 (*)    Platelets 28 (*)    All other components within normal limits  COMPREHENSIVE METABOLIC PANEL - Abnormal; Notable for the following:    Potassium 3.4 (*)    Chloride 99 (*)    Glucose, Bld 124 (*)    BUN 24 (*)    Creatinine, Ser 1.36 (*)    Calcium 8.2 (*)    Total Protein 5.2 (*)    Albumin 2.9 (*)    AST 203 (*)    ALT 361 (*)    Total Bilirubin 6.3 (*)    GFR calc non Af Amer 57 (*)    All other components within normal limits  LACTATE DEHYDROGENASE - Abnormal; Notable for the following:    LDH 515 (*)    All other components within normal limits  LACTIC ACID, PLASMA - Abnormal; Notable for the following:    Lactic Acid, Venous 3.5 (*)    All other components within normal limits  I-STAT CG4 LACTIC ACID, ED - Abnormal; Notable for the following:    Lactic Acid, Venous 3.59 (*)    All other components within normal limits  I-STAT CG4 LACTIC ACID, ED - Abnormal; Notable for the following:    Lactic Acid, Venous 3.36 (*)    All other components within normal limits  CULTURE, BLOOD (ROUTINE X 2)  CULTURE,  BLOOD (ROUTINE X 2)  MRSA PCR SCREENING  PARASITE EXAM SCREEN, BLOOD-W CONF TO [facility] (NOT @ [facility])  URINALYSIS, ROUTINE W REFLEX MICROSCOPIC (NOT AT [facility])  PARASITE EXAM, BLOOD  LACTIC ACID, PLASMA  CBC WITH DIFFERENTIAL/PLATELET  CBC WITH DIFFERENTIAL/PLATELET  PARASITE EXAM SCREEN, BLOOD-W CONF TO [facility] (NOT @ [facility])  COMPREHENSIVE METABOLIC PANEL  MAGNESIUM  PHOSPHORUS  PROTIME-INR  APTT  LACTATE DEHYDROGENASE  SAVE SMEAR  HIV ANTIBODY (ROUTINE TESTING)  TYPE AND SCREEN    EKG  EKG Interpretation  Date/Time:  Friday March 18 2016 15:06:46 EDT Ventricular Rate:  67 PR Interval:    QRS Duration: 106 QT Interval:  414 QTC Calculation: 437 R Axis:   59 Text Interpretation:  Sinus rhythm Atrial premature complex Probable inferior infarct, old When compared to prior: New T wave inversions in inferior leads Confirmed by Nyesha Cliff MD, Leonel Mccollum [phone]) on 03/18/2016 3:18:48 PM       Radiology Dg Chest 2 View  Result Date: 03/18/2016 CLINICAL DATA:  55 year old  male just returned from [place] with fever and weakness. Suspected malaria. Altered mental status during imaging for this exam. Initial encounter. EXAM: CHEST  2 VIEW COMPARISON:  Report of chest radiographs 04/04/2003 (no images available). FINDINGS: Postoperative changes to the right hemi thorax with chronic rib deformities and suspected chronic pleural scarring along the right hemidiaphragm and in the posterior hemi thorax. Associated mild volume reduction in that lung. Mediastinal contours remain within normal limits. Visualized tracheal air column is within normal limits. No pneumothorax, pulmonary edema, definite pleural effusion, or definite acute pulmonary opacity. There is also a chronic right clavicle deformity. No acute osseous abnormality identified. IMPRESSION: Postoperative changes in the right hemi thorax. No definite acute cardiopulmonary abnormality. Electronically Signed   By: Odessa Fleming M.D.   On: 03/18/2016 14:23    Ct Head Wo Contrast  Result Date: 03/18/2016 CLINICAL DATA:  55 year old male with confusion and gait instability. Initial encounter. EXAM: CT HEAD WITHOUT CONTRAST TECHNIQUE: Contiguous axial images were obtained from the base of the skull through the vertex without intravenous contrast. COMPARISON:  Head CT without contrast 08/10/2002 FINDINGS: Chronic appearing opacification of the visible right maxillary sinus. Resolved bilateral mastoid effusions and other paranasal sinus opacification seen in 2003. No acute osseous abnormality identified. Visualized orbit soft tissues are within normal limits. Visualized scalp soft tissues are within normal limits. Mild Calcified atherosclerosis at the skull base. Cerebral volume remains normal. No midline shift, ventriculomegaly, mass effect, evidence of mass lesion, intracranial hemorrhage or evidence of cortically based acute infarction. Subtle inferior right frontal gyrus encephalomalacia corresponding to an area of cerebral contusion in 2003 (series 2, image 9). Elsewhere Gray-white matter differentiation is within normal limits throughout the brain. No suspicious intracranial vascular hyperdensity. IMPRESSION: 1. No acute intracranial abnormality. Negative noncontrast CT appearance of the brain aside from sequelae of 2003 mild right inferior frontal gyrus cerebral contusion. 2. Chronic right maxillary sinus opacification but other paranasal sinus and mastoid opacification seen in 2003 is resolved. Electronically Signed   By: Odessa Fleming M.D.   On: 03/18/2016 14:12    Procedures .Critical Care Performed by: Shaune Pollack Authorized by: Shaune Pollack   Critical care provider statement:    Critical care time (minutes):  45   Critical care time was exclusive of:  Separately billable procedures and treating other patients   Critical care was necessary to treat or prevent imminent or life-threatening deterioration of the following conditions:  CNS failure or  compromise, dehydration, sepsis and metabolic crisis   Critical care was time spent personally by me on the following activities:  Ordering and performing treatments and interventions, development of treatment plan with patient or surrogate, ordering and review of laboratory studies, discussions with consultants, ordering and review of radiographic studies, pulse oximetry, evaluation of patient's response to treatment, discussions with primary provider, re-evaluation of patient's condition, examination of patient and review of old charts   I assumed direction of critical care for this patient from another provider in my specialty: no      (including critical care time)  Medications Ordered in ED Medications  quiNIDine gluconate 1,632 mg in sodium chloride 0.9 % 250 mL IVPB (1,632 mg Intravenous Given 03/18/16 1551)    Followed by  quiNIDine gluconate 816 mg in sodium chloride 0.9 % 250 mL IVPB (not administered)  sodium chloride 0.9 % bolus 1,000 mL (0 mLs Intravenous Stopped 03/18/16 1448)  sodium chloride 0.9 % bolus 1,000 mL (0 mLs Intravenous Stopped 03/18/16 1550)  doxycycline (VIBRAMYCIN) 100  mg in dextrose 5 % 250 mL IVPB (100 mg Intravenous Transfusing/Transfer 03/18/16 1807)  dextrose 5 % and 0.45 % NaCl with KCl 20 mEq/L infusion ( Intravenous Transfusing/Transfer 03/18/16 1807)  potassium chloride SA (K-DUR,KLOR-CON) CR tablet 40 mEq (40 mEq Oral Given 03/18/16 1620)     Initial Impression / Assessment and Plan / ED Course  I have reviewed the triage vital signs and the nursing notes.  Pertinent labs & imaging results that were available during my care of the patient were reviewed by me and considered in my medical decision making (see chart for details).  Clinical Course   55 yo M with no significant PMHx who p/w positive plasmodium falciparum malaria on recent labwork, with concern for cerebral malaria given confusion, ataxia. Pt dehydrated but non-toxic on exam. Lab work shows marked  hepatitis, mild AKI, and + malaria. LDH and bili elevated c/w significant hemolysis. I discussed case in detail with PCP and Infectious Disease. Consulted ID in ED and discussed with consultant at bedside - CDC contacted, quinidine drip started and doxy per ID. No indication for other ABX per ID. Otherwise, discussed with CDC, ICU and will admit to ICU per CDC guidelines. Pt in agreement. IVF, IV ABX given. Pt re-assessed frequently and >30 min spent discussing with CDC, ID, and consultants. Pt stable but with persistent lactic acidosis.  Final Clinical Impressions(s) / ED Diagnoses   Final diagnoses:  Cerebral malaria  Transaminitis  AKI (acute kidney injury) (HCC)  Lactic acidosis  Dehydration  History of foreign travel  Elevated LDH  Elevated bilirubin    New Prescriptions There are no discharge medications for this patient.    Shaune Pollack, MD 03/18/16 [phone]

## 2016-03-18 NOTE — Progress Notes (Signed)
eLink Physician-Brief Progress Note Patient Name: Scott Leonard DOB: 1961/06/24 MRN: [id number]   Date of Service  03/18/2016  HPI/Events of Note  Nausea and vomiting  eICU Interventions  PRN zofran ordered     Intervention Category Minor Interventions: Routine modifications to care plan (e.g. PRN medications for pain, fever)  Assad Harbeson 03/18/2016, 9:56 PM

## 2016-03-18 NOTE — Progress Notes (Signed)
CRITICAL VALUE ALERT  Critical value received:  Lactic acid 3.6  Date of notification:  03/18/16   Time of notification:  20:12  Critical value read back:Yes.    Nurse who received alert:  Nechama Guard  MD notified (1st page): Dr Henrene Pastor  Time of first page:  20:13  Responding MD:  Dr Henrene Pastor  Time MD responded:  20:13

## 2016-03-18 NOTE — H&P (Signed)
PULMONARY / CRITICAL CARE MEDICINE   Name: Scott Leonard MRN: [id number] DOB: 1961/04/03    ADMISSION DATE:  03/18/2016 CONSULTATION DATE: 8/4  REFERRING MD:  Issacs   CHIEF COMPLAINT:  Malaria   HISTORY OF PRESENT ILLNESS:   This is a pleasant 55 year old male w/ only sig medical h/o PNA and sepsis for which he required prior right lobectomy in past. Goes to [place] yearly on mission and education trip for 1 mo. Did not take any prophylaxis. On the 28th had some sort of egg salad meal which had been out for some time and shortly after that he felt some GI distress, poor appetite and intermittent fever. He attributed these symptoms to food poisoning. He continued to feel poorly over the following days. He returned home on 7/30 and his symptoms of intermittent fever, diarrhea and eventually weakness and dizziness continued to worsen to point he was falling at home. He went to his PCP. A CBC was drawn and smear was positive for malaria. He was sent to the ED for further evaluation. On eval was found to have AKI, mild Lactic acidosis and severe thrombocytopenia. PCCM was asked to admit.   PAST MEDICAL HISTORY :  He  has a past medical history of Pneumonia and Sepsis (HCC).  PAST SURGICAL HISTORY: He  has no past surgical history on file.  No Known Allergies  No current facility-administered medications on file prior to encounter.    No current outpatient prescriptions on file prior to encounter.    FAMILY HISTORY:  His has no family status information on file.    SOCIAL HISTORY: He  reports that he has never smoked. He has never used smokeless tobacco. He reports that he does not drink alcohol or use drugs.  REVIEW OF SYSTEMS:   General: + fever, weakness, poor appetite. HENT: no HA, no sore throat. Pulm: no cough, SOB and chest pain. Card: no CP, no palps. No dizziness. Abd: + diarrhea No abd pain. No nausea or vomiting. Neuro: was dizzy prior to admit and had fallen at home. Now feels  better. Endo: no hot or cold sensitivities. No excess thirst.   SUBJECTIVE:  No distress   VITAL SIGNS: BP 95/65   Pulse 71   Temp 98.8 F (37.1 C) (Oral)   Resp 17   Wt 150 lb (68 kg)   SpO2 97%  Room air HEMODYNAMICS:    VENTILATOR SETTINGS:    INTAKE / OUTPUT: No intake/output data recorded.  PHYSICAL EXAMINATION: General: pleasant 55 year old male, in no acute distress.  Neuro:  Awake, oriented. No focal motor def  HEENT:  NCAT, no JVd  Cardiovascular:  RRR w/out MRG Lungs:  Clear to auscultation. Right posterior thorax w/ prior rib removal.  Abdomen:  Soft, not tender  Musculoskeletal:  Equal st and bulk  Skin:  Warm and dry   LABS:  BMET  Recent Labs Lab 03/18/16 1246  NA 136  K 3.4*  CL 99*  CO2 27  BUN 24*  CREATININE 1.36*  GLUCOSE 124*    Electrolytes  Recent Labs Lab 03/18/16 1246  CALCIUM 8.2*    CBC  Recent Labs Lab 03/18/16 1246  WBC 8.0  HGB 13.5  HCT 38.4*  PLT 28*    Coag's No results for input(s): APTT, INR in the last 168 hours.  Sepsis Markers  Recent Labs Lab 03/18/16 1313  LATICACIDVEN 3.59*    ABG No results for input(s): PHART, PCO2ART, PO2ART in the  last 168 hours.  Liver Enzymes  Recent Labs Lab 03/18/16 1246  AST 203*  ALT 361*  ALKPHOS 62  BILITOT 6.3*  ALBUMIN 2.9*    Cardiac Enzymes No results for input(s): TROPONINI, PROBNP in the last 168 hours.  Glucose No results for input(s): GLUCAP in the last 168 hours.  Imaging Dg Chest 2 View  Result Date: 03/18/2016 CLINICAL DATA:  55 year old male just returned from [place] with fever and weakness. Suspected malaria. Altered mental status during imaging for this exam. Initial encounter. EXAM: CHEST  2 VIEW COMPARISON:  Report of chest radiographs 04/04/2003 (no images available). FINDINGS: Postoperative changes to the right hemi thorax with chronic rib deformities and suspected chronic pleural scarring along the right hemidiaphragm and in the  posterior hemi thorax. Associated mild volume reduction in that lung. Mediastinal contours remain within normal limits. Visualized tracheal air column is within normal limits. No pneumothorax, pulmonary edema, definite pleural effusion, or definite acute pulmonary opacity. There is also a chronic right clavicle deformity. No acute osseous abnormality identified. IMPRESSION: Postoperative changes in the right hemi thorax. No definite acute cardiopulmonary abnormality. Electronically Signed   By: Odessa Fleming M.D.   On: 03/18/2016 14:23   Ct Head Wo Contrast  Result Date: 03/18/2016 CLINICAL DATA:  55 year old male with confusion and gait instability. Initial encounter. EXAM: CT HEAD WITHOUT CONTRAST TECHNIQUE: Contiguous axial images were obtained from the base of the skull through the vertex without intravenous contrast. COMPARISON:  Head CT without contrast 08/10/2002 FINDINGS: Chronic appearing opacification of the visible right maxillary sinus. Resolved bilateral mastoid effusions and other paranasal sinus opacification seen in 2003. No acute osseous abnormality identified. Visualized orbit soft tissues are within normal limits. Visualized scalp soft tissues are within normal limits. Mild Calcified atherosclerosis at the skull base. Cerebral volume remains normal. No midline shift, ventriculomegaly, mass effect, evidence of mass lesion, intracranial hemorrhage or evidence of cortically based acute infarction. Subtle inferior right frontal gyrus encephalomalacia corresponding to an area of cerebral contusion in 2003 (series 2, image 9). Elsewhere Gray-white matter differentiation is within normal limits throughout the brain. No suspicious intracranial vascular hyperdensity. IMPRESSION: 1. No acute intracranial abnormality. Negative noncontrast CT appearance of the brain aside from sequelae of 2003 mild right inferior frontal gyrus cerebral contusion. 2. Chronic right maxillary sinus opacification but other paranasal  sinus and mastoid opacification seen in 2003 is resolved. Electronically Signed   By: Odessa Fleming M.D.   On: 03/18/2016 14:12     STUDIES:  CT head 8/4: 1. No acute intracranial abnormality. Negative noncontrast CT appearance of the brain aside from sequelae of 2003 mild right inferior frontal gyrus cerebral contusion. 2. Chronic right maxillary sinus opacification but other paranasal sinus and mastoid opacification seen in 2003 is resolved.  CULTURES: Parasite blood screen 8/4>>> BCX2 8/4 >>>> UC 8/4>>>  ANTIBIOTICS: Doxy 8/4>>> Quinidine 8/4>>>  SIGNIFICANT EVENTS:   LINES/TUBES:   DISCUSSION: Recently back from [place]. Since 7/30 has had poor po intake and diarrhea. Fever worse. Presented to PCP 8/4 after falling at home. Seen by PCP, CBC obtained-->smear + malaria. Sent to ED, chemistry showed mild AKI and had mild Lactic acidosis. He felt much better after IV fluids. He was seen by ID service who stated ICU admit necessary for monitoring of quinidine side effects and to closely monitor for neurological changes   ASSESSMENT / PLAN:  PULMONARY A: Prior RLL Lobectomy-->no acute issues  P:   Wean O2  CARDIOVASCULAR A:  No acute  P:  Tele  Monitor QTc on quinidine   RENAL A:   AKI Moderate lactic acidosis  Hypokalemia  P:   IV hydration  Repeat LA  Replace K Repeat chemistry in am   GASTROINTESTINAL A:   Nausea and diarrhea  Abnormal LFTs P:   clears Repeat in am   HEMATOLOGIC A:   hemolysis  Severe thrombocytopenia  P:  Trend cbc and LDH Blood smear daily    INFECTIOUS A:   Malaria  Id is following P:   IV Quinidine and Doxy per ID  F/u parasite screen Additional recs per ID   ENDOCRINE A:   Mild hyperglycemia  P:   Ck glucose on am labs   NEUROLOGIC A:   Gait disturbance and fall-->resolved after hydration  CT head negative  P:   Supportive care Neuro checks q 4   Simonne Martinet ACNP-BC [facility]/Critical [facility] Pager  # [phone] OR # [phone] if no answer  03/18/2016, 3:30 PM  STAFF NOTE: I, Rory Percy, MD FACP have personally reviewed patient's available data, including medical history, events of note, physical examination and test results as part of my evaluation. I have discussed with resident/NP and other care providers such as pharmacist, RN and RRT. In addition, I personally evaluated patient and elicited key findings of: awake, fully alert, nonfocal examination, no spleen tip, no petechia, CTA, malaria noted on smear, thrombocytopenia, bili ldh indicative of hemolysis, ID consulted, he was initially hypovolemic but now improved with volume, continue volume, follow renal fxn and liver, ldh, fractionated bili and cbc closely, I think his initial confusion was from hypovolemia and is resolved and NOT indicative of cerebral malaria, wil observe in ICU overnight and likely move out in am if uneventful, monitor QTC closely, I have updated pt in full The patient is critically ill with multiple organ systems failure and requires high complexity decision making for assessment and support, frequent evaluation and titration of therapies, application of advanced monitoring technologies and extensive interpretation of multiple databases.   Critical Care Time devoted to patient care services described in this note is 35 Minutes. This time reflects time of care of this signee: Rory Percy, MD FACP. This critical care time does not reflect procedure time, or teaching time or supervisory time of PA/NP/Med student/Med Resident etc but could involve care discussion time. Rest per NP/medical resident whose note is outlined above and that I agree with   Mcarthur Rossetti. Tyson Alias, MD, FACP Pgr: [phone] [place] [facility] 03/18/2016 4:18 PM

## 2016-03-19 DIAGNOSIS — B508 Other severe and complicated Plasmodium falciparum malaria: Secondary | ICD-10-CM

## 2016-03-19 DIAGNOSIS — D696 Thrombocytopenia, unspecified: Secondary | ICD-10-CM

## 2016-03-19 DIAGNOSIS — G9340 Encephalopathy, unspecified: Secondary | ICD-10-CM

## 2016-03-19 LAB — CBC WITH DIFFERENTIAL/PLATELET
BASOS PCT: 1 %
Basophils Absolute: 0.1 10*3/uL (ref 0.0–0.1)
EOS PCT: 3 %
Eosinophils Absolute: 0.2 10*3/uL (ref 0.0–0.7)
HEMATOCRIT: 33.5 % — AB (ref 39.0–52.0)
HEMOGLOBIN: 11.4 g/dL — AB (ref 13.0–17.0)
Lymphocytes Relative: 12 %
Lymphs Abs: 0.8 10*3/uL (ref 0.7–4.0)
MCH: 29.6 pg (ref 26.0–34.0)
MCHC: 34 g/dL (ref 30.0–36.0)
MCV: 87 fL (ref 78.0–100.0)
MONO ABS: 0.5 10*3/uL (ref 0.1–1.0)
MONOS PCT: 8 %
NEUTROS PCT: 76 %
Neutro Abs: 4.9 10*3/uL (ref 1.7–7.7)
Platelets: 20 10*3/uL — CL (ref 150–400)
RBC: 3.85 MIL/uL — ABNORMAL LOW (ref 4.22–5.81)
RDW: 13.8 % (ref 11.5–15.5)
WBC Morphology: INCREASED
WBC: 6.5 10*3/uL (ref 4.0–10.5)

## 2016-03-19 LAB — PROTIME-INR
INR: 1.24
Prothrombin Time: 15.7 seconds — ABNORMAL HIGH (ref 11.4–15.2)

## 2016-03-19 LAB — PHOSPHORUS: Phosphorus: 1.5 mg/dL — ABNORMAL LOW (ref 2.5–4.6)

## 2016-03-19 LAB — COMPREHENSIVE METABOLIC PANEL
ALBUMIN: 2.4 g/dL — AB (ref 3.5–5.0)
ALK PHOS: 55 U/L (ref 38–126)
ALT: 309 U/L — AB (ref 17–63)
ANION GAP: 10 (ref 5–15)
AST: 216 U/L — AB (ref 15–41)
BILIRUBIN TOTAL: 3.3 mg/dL — AB (ref 0.3–1.2)
BUN: 20 mg/dL (ref 6–20)
CALCIUM: 7.2 mg/dL — AB (ref 8.9–10.3)
CO2: 22 mmol/L (ref 22–32)
CREATININE: 1.24 mg/dL (ref 0.61–1.24)
Chloride: 106 mmol/L (ref 101–111)
GFR calc Af Amer: >60 mL/min (ref >60)
GFR calc non Af Amer: >60 mL/min (ref >60)
GLUCOSE: 128 mg/dL — AB (ref 65–99)
Potassium: 3.5 mmol/L (ref 3.5–5.1)
SODIUM: 138 mmol/L (ref 135–145)
TOTAL PROTEIN: 4.5 g/dL — AB (ref 6.5–8.1)

## 2016-03-19 LAB — SAVE SMEAR

## 2016-03-19 LAB — GLUCOSE, CAPILLARY: Glucose-Capillary: 140 mg/dL — ABNORMAL HIGH (ref 65–99)

## 2016-03-19 LAB — PATHOLOGIST SMEAR REVIEW

## 2016-03-19 LAB — MAGNESIUM: Magnesium: 1.7 mg/dL (ref 1.7–2.4)

## 2016-03-19 LAB — LACTATE DEHYDROGENASE: LDH: 505 U/L — AB (ref 98–192)

## 2016-03-19 LAB — PARASITE EXAM SCREEN, BLOOD-W CONF TO LABCORP (NOT @ ARMC)

## 2016-03-19 LAB — APTT: aPTT: 34 seconds (ref 24–36)

## 2016-03-19 LAB — HIV ANTIBODY (ROUTINE TESTING W REFLEX): HIV SCREEN 4TH GENERATION: NONREACTIVE

## 2016-03-19 MED ORDER — MAGNESIUM SULFATE 2 GM/50ML IV SOLN
2.0000 g | Freq: Once | INTRAVENOUS | Status: AC
  Administered 2016-03-19: 2 g via INTRAVENOUS
  Filled 2016-03-19: qty 50

## 2016-03-19 MED ORDER — ACETAMINOPHEN 325 MG PO TABS
650.0000 mg | ORAL_TABLET | Freq: Four times a day (QID) | ORAL | Status: DC | PRN
  Administered 2016-03-19: 650 mg via ORAL
  Filled 2016-03-19 (×2): qty 2

## 2016-03-19 MED ORDER — DOXYCYCLINE HYCLATE 100 MG IV SOLR
100.0000 mg | Freq: Two times a day (BID) | INTRAVENOUS | Status: DC
  Administered 2016-03-19 (×3): 100 mg via INTRAVENOUS
  Filled 2016-03-19 (×5): qty 100

## 2016-03-19 MED ORDER — PROMETHAZINE HCL 6.25 MG/5ML PO SYRP
12.5000 mg | ORAL_SOLUTION | Freq: Four times a day (QID) | ORAL | Status: DC | PRN
  Filled 2016-03-19: qty 10

## 2016-03-19 MED ORDER — ACETAMINOPHEN 650 MG RE SUPP
650.0000 mg | Freq: Four times a day (QID) | RECTAL | Status: DC | PRN
Start: 2016-03-19 — End: 2016-03-20
  Administered 2016-03-19: 650 mg via RECTAL
  Filled 2016-03-19: qty 1

## 2016-03-19 MED ORDER — POTASSIUM PHOSPHATES 15 MMOLE/5ML IV SOLN
30.0000 mmol | Freq: Once | INTRAVENOUS | Status: AC
  Administered 2016-03-19: 30 mmol via INTRAVENOUS
  Filled 2016-03-19: qty 10

## 2016-03-19 NOTE — Progress Notes (Signed)
Patient has become increasing confused. Upon assessment at 02:00 patient was alert and oriented x4 but was confused about the time of day he stated he "was becoming more confused." Upon assessment at 04:00 he is alert and oriented to self only with dysphasia. Dr. Darrick Penna made aware. No further orders at this time. Will continue to monitor.

## 2016-03-19 NOTE — Progress Notes (Signed)
eLink Physician-Brief Progress Note Patient Name: Scott Leonard DOB: 1961-07-24 MRN: [id number]   Date of Service  03/19/2016  HPI/Events of Note  Hypokalemia, hypomag, hypophos  eICU Interventions  Potassium, mag and phos replaced     Intervention Category Intermediate Interventions: Electrolyte abnormality - evaluation and management  DETERDING,ELIZABETH 03/19/2016, 5:39 AM

## 2016-03-19 NOTE — Progress Notes (Signed)
[place] TEAM 1 - Stepdown/ICU TEAM  Scott Leonard  ZOX:[id number] DOB: [birth year] DOA: 03/18/2016 PCP: No PCP Per Patient    Brief Narrative:  55 year old male w/ h/o PNA and sepsis for which he required right lobectomy who goes to [place] for a month yearly on a mission and education trip. He did not take any prophylaxis. On the 7/28 he felt GI distress, poor appetite and intermittent fever. He attributed these symptoms to food poisoning. He continued to feel poorly over the following days. He returned home on 7/30 and his intermittent fever, diarrhea, weakness, and dizziness continued to worsen to the point he was falling at home. He went to his PCP. A CBC was drawn and smear was positive for malaria. He was sent to the ED for further evaluation where he was found to have AKI, mild lactic acidosis, and severe thrombocytopenia. PCCM was asked to admit, and he was evaluated by ID.     Subjective: Pt was examined by PCCM today.  TRH is assuming care but did not examine today as his acute issues have all been addressed by PCCM for today.    Assessment & Plan:  Severe plasmodium falciparum malaria  IV Quinidine and Doxy per ID   Hemolysis / Severe thrombocytopenia   QTc prolonged on IV Quinidine  437>561 - cont to monitor QTc on quinidine - d/c zofran / avoid all potential QTc prolonging drugs as able    AKI  Moderate lactic acidosis   Hypokalemia   Hypomag  Hypophos   Nausea and diarrhea - Abnormal LFTs  Prior RLL Lobectomy no acute issues   Mild hyperglycemia   DVT prophylaxis: SCDs Code Status: FULL CODE Family Communication: no family present at time of exam  Disposition Plan:   Consultants:  PCCM ID  Procedures: none  Antimicrobials:  Doxycycline 8/4 >  Objective: Blood pressure 112/63, pulse 77, temperature (!) 102.4 F (39.1 C), temperature source Oral, resp. rate (!) 21, weight 67.7 kg (149 lb 4 oz), SpO2 95 %.  Intake/Output Summary (Last 24 hours) at  03/19/16 1002 Last data filed at 03/19/16 0511  Gross per 24 hour  Intake             2870 ml  Output              240 ml  Net             2630 ml   Filed Weights   03/18/16 1433 03/19/16 0500  Weight: 68 kg (150 lb) 67.7 kg (149 lb 4 oz)    Examination: No exam today - no charge   CBC:  Recent Labs Lab 03/18/16 1246 03/18/16 1842 03/19/16 0423  WBC 8.0 7.3 6.5  NEUTROABS 6.0 5.2 4.9  HGB 13.5 11.0* 11.4*  HCT 38.4* 32.6* 33.5*  MCV 86.1 86.7 87.0  PLT 28* 21* 20*   Basic Metabolic Panel:  Recent Labs Lab 03/18/16 1246 03/19/16 0423  NA 136 138  K 3.4* 3.5  CL 99* 106  CO2 27 22  GLUCOSE 124* 128*  BUN 24* 20  CREATININE 1.36* 1.24  CALCIUM 8.2* 7.2*  MG  --  1.7  PHOS  --  1.5*   GFR: CrCl cannot be calculated (Unknown ideal weight.).  Liver Function Tests:  Recent Labs Lab 03/18/16 1246 03/19/16 0423  AST 203* 216*  ALT 361* 309*  ALKPHOS 62 55  BILITOT 6.3* 3.3*  PROT 5.2* 4.5*  ALBUMIN 2.9* 2.4*   Coagulation  Profile:  Recent Labs Lab 03/19/16 0423  INR 1.24    Recent Results (from the past 240 hour(s))  Blood culture (routine x 2)     Status: None (Preliminary result)   Collection Time: 03/18/16  2:50 PM  Result Value Ref Range Status   Specimen Description BLOOD LEFT ANTECUBITAL  Final   Special Requests BOTTLES DRAWN AEROBIC AND ANAEROBIC  10CC  Final   Culture NO GROWTH < 12 HOURS  Final   Report Status PENDING  Incomplete  Blood culture (routine x 2)     Status: None (Preliminary result)   Collection Time: 03/18/16  3:00 PM  Result Value Ref Range Status   Specimen Description BLOOD LEFT HAND  Final   Special Requests BOTTLES DRAWN AEROBIC AND ANAEROBIC  10CC  Final   Culture NO GROWTH < 12 HOURS  Final   Report Status PENDING  Incomplete  MRSA PCR Screening     Status: None   Collection Time: 03/18/16  6:49 PM  Result Value Ref Range Status   MRSA by PCR NEGATIVE NEGATIVE Final    Comment:        The GeneXpert MRSA  Assay (FDA approved for NASAL specimens only), is one component of a comprehensive MRSA colonization surveillance program. It is not intended to diagnose MRSA infection nor to guide or monitor treatment for MRSA infections.      Scheduled Meds: . doxycycline (VIBRAMYCIN) IV  100 mg Intravenous BID  . potassium phosphate IVPB (mmol)  30 mmol Intravenous Once  . quiNIDine (MALARIA_) MAINTENANCE DOSE  12 mg/kg Intravenous Q8H    LOS: 1 day   Lonia Blood, MD [facility] Office  [phone] Pager - Text Page per Amion as per below:  On-Call/Text Page:      [email or web address]      password [email or web address]  If 7PM-7AM, please contact night-coverage [email or web address] Password [email or web address] 03/19/2016, 10:02 AM

## 2016-03-19 NOTE — Progress Notes (Signed)
[facility] ADULT ICU REPLACEMENT PROTOCOL FOR AM LAB REPLACEMENT ONLY  The patient does not apply for the [facility] Adult ICU Electrolyte Replacment Protocol based on the criteria listed below:   1. Is GFR >/= 40 ml/min? Yes.    Patient's GFR today is >60 2. Is urine output >/= 0.5 ml/kg/hr for the last 6 hours? No. Patient's UOP is NONE RECORDED ml/kg/hr 3. Is BUN < 60 mg/dL? Yes.    Patient's BUN today is 20 4. Abnormal electrolyte(s): K+ 3.5 Mg 1.7 Phos 1.5 5. Ordered repletion with: NA 6. If a panic level lab has been reported, has the CCM MD in charge been notified? Yes.  .   Physician:  E Deterding  Cathlean Cower [facility] 03/19/2016 5:33 AM

## 2016-03-19 NOTE — Progress Notes (Signed)
Reported temperature of 103 from nurse tech. Dr. Darrick Penna made aware and Tylenol PO ordered. Patient was unable to swallow the pills. Dr Deterding made aware and suppository order placed. No other orders placed at this time.

## 2016-03-19 NOTE — Progress Notes (Signed)
Patient ID: Scott Leonard, male   DOB: [birth year], 55 y.o.   MRN: [id number]          [facility] for Infectious Disease    Date of Admission:  03/18/2016           Day 1 quinidine and doxycycline  Principal Problem:   Other severe and complicated Plasmodium falciparum malaria Active Problems:   Lactic acidosis   Thrombocytopenia (HCC)   . doxycycline (VIBRAMYCIN) IV  100 mg Intravenous BID  . quiNIDine (MALARIA_) MAINTENANCE DOSE  12 mg/kg Intravenous Q8H   Review of Systems: Review of Systems  Unable to perform ROS: Language    Past Medical History:  Diagnosis Date  . Lactic acidosis 03/18/2016  . Other severe and complicated Plasmodium falciparum malaria 03/18/2016  . Pneumonia   . Sepsis (HCC)   . Thrombocytopenia (HCC) 03/18/2016    Social History  Substance Use Topics  . Smoking status: Never Smoker  . Smokeless tobacco: Never Used  . Alcohol use No    History reviewed. No pertinent family history. No Known Allergies  OBJECTIVE: Vitals:   03/19/16 0900 03/19/16 1000 03/19/16 1100 03/19/16 1200  BP: (!) 109/59 (!) 107/58 (!) 116/59 113/63  Pulse: 71 68 72 74  Resp: 16 (!) 26 (!) 23 (!) 23  Temp:      TempSrc:      SpO2: 96% 95% 96% 94%  Weight:      Height:       Body mass index is 22.04 kg/m.  Physical Exam  Constitutional:  He is alert and in no distress resting in bed. His wife is at the bedside.  Eyes:  Slight conjunctival redness bilaterally.  Neck: Neck supple.  Cardiovascular: Normal rate and regular rhythm.   No murmur heard. Pulmonary/Chest: Effort normal and breath sounds normal. He has no wheezes. He exhibits no tenderness.  Abdominal: Soft. There is no tenderness.  Musculoskeletal: Normal range of motion. He exhibits no edema or tenderness.  Neurological: He is alert.  He appears to have expressive aphasia. Both his wife and his nurse date that the longer they engage him in conversation the better he is able to answer questions. He  is able to state his wedding anniversary and he recognized a friend and called him by name earlier this morning.  Skin: No rash noted.    Lab Results Lab Results  Component Value Date   WBC 6.5 03/19/2016   HGB 11.4 (L) 03/19/2016   HCT 33.5 (L) 03/19/2016   MCV 87.0 03/19/2016   PLT 20 (LL) 03/19/2016    Lab Results  Component Value Date   CREATININE 1.24 03/19/2016   BUN 20 03/19/2016   NA 138 03/19/2016   K 3.5 03/19/2016   CL 106 03/19/2016   CO2 22 03/19/2016    Lab Results  Component Value Date   ALT 309 (H) 03/19/2016   AST 216 (H) 03/19/2016   ALKPHOS 55 03/19/2016   BILITOT 3.3 (H) 03/19/2016     Microbiology: Recent Results (from the past 240 hour(s))  Blood culture (routine x 2)     Status: None (Preliminary result)   Collection Time: 03/18/16  2:50 PM  Result Value Ref Range Status   Specimen Description BLOOD LEFT ANTECUBITAL  Final   Special Requests BOTTLES DRAWN AEROBIC AND ANAEROBIC  10CC  Final   Culture NO GROWTH < 12 HOURS  Final   Report Status PENDING  Incomplete  Blood culture (routine x 2)  Status: None (Preliminary result)   Collection Time: 03/18/16  3:00 PM  Result Value Ref Range Status   Specimen Description BLOOD LEFT HAND  Final   Special Requests BOTTLES DRAWN AEROBIC AND ANAEROBIC  10CC  Final   Culture NO GROWTH < 12 HOURS  Final   Report Status PENDING  Incomplete  MRSA PCR Screening     Status: None   Collection Time: 03/18/16  6:49 PM  Result Value Ref Range Status   MRSA by PCR NEGATIVE NEGATIVE Final    Comment:        The GeneXpert MRSA Assay (FDA approved for NASAL specimens only), is one component of a comprehensive MRSA colonization surveillance program. It is not intended to diagnose MRSA infection nor to guide or monitor treatment for MRSA infections.      ASSESSMENT: He has severe malaria. 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 He  had a very high level parasitemia (26%) yesterday. His malaria smear was repeated this morning and still positive. I called the [facility] and there tach who reads the smears to determine the percent parasitemia is not working on weekends so we will not have any information from his smears to track his progress. I do not think a repeat brain scan is needed at this time. He has only been on therapy for 24 hours and I am still quite hopeful he will recover. There is no evidence so far of any other infection or other reasons for his aphasia.  PLAN: 1. Continue quinidine and doxycycline  Cliffton Asters, MD [facility] Infect[facility] [phone] pager   [phone] cell 03/19/2016, 2:06 PM

## 2016-03-19 NOTE — Progress Notes (Signed)
PULMONARY / CRITICAL CARE MEDICINE   Name: Scott Leonard MRN: [id number] DOB: 1961-05-15    ADMISSION DATE:  03/18/2016 CONSULTATION DATE: 8/4  REFERRING MD:  Issacs   CHIEF COMPLAINT:  Malaria   HISTORY OF PRESENT ILLNESS:   This is a pleasant 55 year old male w/ only sig medical h/o PNA and sepsis for which he required prior right lobectomy in past. Goes to [place] yearly on mission and education trip for 1 mo. Did not take any prophylaxis. On the 28th had some sort of egg salad meal which had been out for some time and shortly after that he felt some GI distress, poor appetite and intermittent fever. He attributed these symptoms to food poisoning. He continued to feel poorly over the following days. He returned home on 7/30 and his symptoms of intermittent fever, diarrhea and eventually weakness and dizziness continued to worsen to point he was falling at home. He went to his PCP. A CBC was drawn and smear was positive for malaria. He was sent to the ED for further evaluation. On eval was found to have AKI, mild Lactic acidosis and severe thrombocytopenia. PCCM was asked to admit.   SUBJECTIVE:  Fever Tmax 103 overnight  Intermittent nausea, no vomiting .  Following commands, oriented x3      VITAL SIGNS: BP 112/63   Pulse 77   Temp 99.3 F (37.4 C) (Oral)   Resp (!) 21   Wt 67.7 kg (149 lb 4 oz)   SpO2 95%  Room air HEMODYNAMICS:    VENTILATOR SETTINGS:    INTAKE / OUTPUT: I/O last 3 completed shifts: In: 2870 [P.O.:120; I.V.:2000; IV Piggyback:750] Out: 240 [Urine:240]  PHYSICAL EXAMINATION: General: pleasant 55 year old male, in no acute distress.  Neuro:  Awake, oriented. No focal motor def  HEENT:  NCAT, no JVd  Cardiovascular:  RRR w/out MRG Lungs:  Clear to auscultation.  Abdomen:  Soft, not tender  Musculoskeletal:  Equal st and bulk  Skin:  Warm and dry   LABS:  BMET  Recent Labs Lab 03/18/16 1246 03/19/16 0423  NA 136 138  K 3.4* 3.5  CL 99* 106   CO2 27 22  BUN 24* 20  CREATININE 1.36* 1.24  GLUCOSE 124* 128*    Electrolytes  Recent Labs Lab 03/18/16 1246 03/19/16 0423  CALCIUM 8.2* 7.2*  MG  --  1.7  PHOS  --  1.5*    CBC  Recent Labs Lab 03/18/16 1246 03/18/16 1842 03/19/16 0423  WBC 8.0 7.3 6.5  HGB 13.5 11.0* 11.4*  HCT 38.4* 32.6* 33.5*  PLT 28* 21* 20*    Coag's  Recent Labs Lab 03/19/16 0423  APTT 34  INR 1.24    Sepsis Markers  Recent Labs Lab 03/18/16 1602 03/18/16 1634 03/18/16 1842  LATICACIDVEN 3.36* 3.5* 3.6*    ABG No results for input(s): PHART, PCO2ART, PO2ART in the last 168 hours.  Liver Enzymes  Recent Labs Lab 03/18/16 1246 03/19/16 0423  AST 203* 216*  ALT 361* 309*  ALKPHOS 62 55  BILITOT 6.3* 3.3*  ALBUMIN 2.9* 2.4*    Cardiac Enzymes No results for input(s): TROPONINI, PROBNP in the last 168 hours.  Glucose No results for input(s): GLUCAP in the last 168 hours.  Imaging Dg Chest 2 View  Result Date: 03/18/2016 CLINICAL DATA:  55 year old male just returned from [place] with fever and weakness. Suspected malaria. Altered mental status during imaging for this exam. Initial encounter. EXAM: CHEST  2 VIEW COMPARISON:  Report of chest radiographs 04/04/2003 (no images available). FINDINGS: Postoperative changes to the right hemi thorax with chronic rib deformities and suspected chronic pleural scarring along the right hemidiaphragm and in the posterior hemi thorax. Associated mild volume reduction in that lung. Mediastinal contours remain within normal limits. Visualized tracheal air column is within normal limits. No pneumothorax, pulmonary edema, definite pleural effusion, or definite acute pulmonary opacity. There is also a chronic right clavicle deformity. No acute osseous abnormality identified. IMPRESSION: Postoperative changes in the right hemi thorax. No definite acute cardiopulmonary abnormality. Electronically Signed   By: Odessa Fleming M.D.   On: 03/18/2016  14:23   Ct Head Wo Contrast  Result Date: 03/18/2016 CLINICAL DATA:  55 year old male with confusion and gait instability. Initial encounter. EXAM: CT HEAD WITHOUT CONTRAST TECHNIQUE: Contiguous axial images were obtained from the base of the skull through the vertex without intravenous contrast. COMPARISON:  Head CT without contrast 08/10/2002 FINDINGS: Chronic appearing opacification of the visible right maxillary sinus. Resolved bilateral mastoid effusions and other paranasal sinus opacification seen in 2003. No acute osseous abnormality identified. Visualized orbit soft tissues are within normal limits. Visualized scalp soft tissues are within normal limits. Mild Calcified atherosclerosis at the skull base. Cerebral volume remains normal. No midline shift, ventriculomegaly, mass effect, evidence of mass lesion, intracranial hemorrhage or evidence of cortically based acute infarction. Subtle inferior right frontal gyrus encephalomalacia corresponding to an area of cerebral contusion in 2003 (series 2, image 9). Elsewhere Gray-white matter differentiation is within normal limits throughout the brain. No suspicious intracranial vascular hyperdensity. IMPRESSION: 1. No acute intracranial abnormality. Negative noncontrast CT appearance of the brain aside from sequelae of 2003 mild right inferior frontal gyrus cerebral contusion. 2. Chronic right maxillary sinus opacification but other paranasal sinus and mastoid opacification seen in 2003 is resolved. Electronically Signed   By: Odessa Fleming M.D.   On: 03/18/2016 14:12     STUDIES:  CT head 8/4: 1. No acute intracranial abnormality. Negative noncontrast CT appearance of the brain aside from sequelae of 2003 mild right inferior frontal gyrus cerebral contusion. 2. Chronic right maxillary sinus opacification but other paranasal sinus and mastoid opacification seen in 2003 is resolved.  CULTURES: Parasite blood screen 8/4>>> BCX2 8/4 >>>> UC  8/4>>>  ANTIBIOTICS: Doxy 8/4>>> Quinidine 8/4>>>  SIGNIFICANT EVENTS:   LINES/TUBES:   DISCUSSION: Recently back from [place]. Since 7/30 has had poor po intake and diarrhea. Fever worse. Presented to PCP 8/4 after falling at home. Seen by PCP, CBC obtained-->smear + malaria. Sent to ED, chemistry showed mild AKI and had mild Lactic acidosis. He felt much better after IV fluids. He was seen by ID service who stated ICU admit necessary for monitoring of quinidine side effects and to closely monitor for neurological changes   ASSESSMENT / PLAN:  PULMONARY A: Prior RLL Lobectomy-->no acute issues  P:   Wean O2  CARDIOVASCULAR A:  QTc prolonged >437>561 P:  Tele  Monitor QTc on quinidine  D/c zofran   RENAL A:   AKI Moderate lactic acidosis  Hypokalemia  Hypomag Hypophos  P:   IV hydration  Tr  LA   K, Mg , Phos  replaced  Repeat chemistry in am   GASTROINTESTINAL A:   Nausea and diarrhea  Abnormal LFTs P:   clears Repeat in am   HEMATOLOGIC A:   hemolysis  Severe thrombocytopenia  P:  Trend cbc and LDH Blood smear daily    INFECTIOUS A:  Malaria  Id is following P:   IV Quinidine and Doxy per ID  F/u parasite scree Additional recs per ID   ENDOCRINE A:   Mild hyperglycemia  P:   Ck glucose on am labs   NEUROLOGIC A:   Gait disturbance and fall-->resolved after hydration  CT head negative  P:   Supportive care Neuro checks q 4  Zyquan Crotty NP-C  [facility]  [phone]   03/19/2016, 8:00 AM

## 2016-03-20 LAB — COMPREHENSIVE METABOLIC PANEL
ALBUMIN: 2.1 g/dL — AB (ref 3.5–5.0)
ALT: 371 U/L — ABNORMAL HIGH (ref 17–63)
ANION GAP: 9 (ref 5–15)
AST: 346 U/L — ABNORMAL HIGH (ref 15–41)
Alkaline Phosphatase: 47 U/L (ref 38–126)
BILIRUBIN TOTAL: 2.6 mg/dL — AB (ref 0.3–1.2)
BUN: 28 mg/dL — ABNORMAL HIGH (ref 6–20)
CALCIUM: 7 mg/dL — AB (ref 8.9–10.3)
CO2: 24 mmol/L (ref 22–32)
Chloride: 103 mmol/L (ref 101–111)
Creatinine, Ser: 1.69 mg/dL — ABNORMAL HIGH (ref 0.61–1.24)
GFR, EST AFRICAN AMERICAN: 51 mL/min — AB (ref >60)
GFR, EST NON AFRICAN AMERICAN: 44 mL/min — AB (ref >60)
Glucose, Bld: 117 mg/dL — ABNORMAL HIGH (ref 65–99)
POTASSIUM: 3.5 mmol/L (ref 3.5–5.1)
Sodium: 136 mmol/L (ref 135–145)
TOTAL PROTEIN: 4.1 g/dL — AB (ref 6.5–8.1)

## 2016-03-20 LAB — SAVE SMEAR

## 2016-03-20 LAB — CBC WITH DIFFERENTIAL/PLATELET
Basophils Absolute: 0.1 10*3/uL (ref 0.0–0.1)
Basophils Relative: 1 %
EOS PCT: 6 %
Eosinophils Absolute: 0.4 10*3/uL (ref 0.0–0.7)
HEMATOCRIT: 29.5 % — AB (ref 39.0–52.0)
Hemoglobin: 10.6 g/dL — ABNORMAL LOW (ref 13.0–17.0)
LYMPHS ABS: 0.9 10*3/uL (ref 0.7–4.0)
Lymphocytes Relative: 14 %
MCH: 30.4 pg (ref 26.0–34.0)
MCHC: 35.9 g/dL (ref 30.0–36.0)
MCV: 84.5 fL (ref 78.0–100.0)
MONO ABS: 0.6 10*3/uL (ref 0.1–1.0)
Monocytes Relative: 9 %
NEUTROS ABS: 4.2 10*3/uL (ref 1.7–7.7)
Neutrophils Relative %: 70 %
Platelets: 26 10*3/uL — CL (ref 150–400)
RBC: 3.49 MIL/uL — AB (ref 4.22–5.81)
RDW: 14.6 % (ref 11.5–15.5)
WBC Morphology: INCREASED
WBC: 6.2 10*3/uL (ref 4.0–10.5)

## 2016-03-20 LAB — MAGNESIUM: MAGNESIUM: 2.2 mg/dL (ref 1.7–2.4)

## 2016-03-20 LAB — LACTIC ACID, PLASMA: LACTIC ACID, VENOUS: 2 mmol/L — AB (ref 0.5–1.9)

## 2016-03-20 LAB — LACTATE DEHYDROGENASE: LDH: 760 U/L — AB (ref 98–192)

## 2016-03-20 LAB — PHOSPHORUS: Phosphorus: 2.3 mg/dL — ABNORMAL LOW (ref 2.5–4.6)

## 2016-03-20 MED ORDER — K PHOS MONO-SOD PHOS DI & MONO 155-852-130 MG PO TABS
500.0000 mg | ORAL_TABLET | Freq: Three times a day (TID) | ORAL | Status: AC
  Administered 2016-03-20 – 2016-03-21 (×6): 500 mg via ORAL
  Filled 2016-03-20 (×7): qty 2

## 2016-03-20 MED ORDER — ARTEMETHER-LUMEFANTRINE 20-120 MG PO TABS
4.0000 | ORAL_TABLET | Freq: Once | ORAL | Status: AC
  Administered 2016-03-20: 4 via ORAL
  Filled 2016-03-20: qty 4

## 2016-03-20 MED ORDER — SODIUM CHLORIDE 0.9 % IV SOLN
INTRAVENOUS | Status: DC
  Administered 2016-03-21 – 2016-03-23 (×3): via INTRAVENOUS

## 2016-03-20 MED ORDER — ACETAMINOPHEN 325 MG PO TABS: 325.0000 mg | ORAL_TABLET | Freq: Four times a day (QID) | ORAL | Status: DC | PRN

## 2016-03-20 MED ORDER — ARTEMETHER-LUMEFANTRINE 20-120 MG PO TABS
4.0000 | ORAL_TABLET | Freq: Two times a day (BID) | ORAL | Status: AC
  Administered 2016-03-21 – 2016-03-22 (×4): 4 via ORAL
  Filled 2016-03-20 (×4): qty 4

## 2016-03-20 MED ORDER — ARTEMETHER-LUMEFANTRINE 20-120 MG PO TABS
4.0000 | ORAL_TABLET | Freq: Once | ORAL | Status: AC
  Administered 2016-03-20: 4 via ORAL
  Filled 2016-03-20 (×2): qty 4

## 2016-03-20 NOTE — Progress Notes (Signed)
EKG CRITICAL VALUE     12 lead EKG performed.  Critical value noted  DE DE, RN notified.   Wandalee FerdinandKimberly Leonard, CCT 03/20/2016 8:21 AM

## 2016-03-20 NOTE — Progress Notes (Signed)
TEAM 1 - Stepdown/ICU TEAM  GANESH DEEG  QQV:[id number] DOB: 1961-07-05 DOA: 03/18/2016 PCP: No PCP Per Patient    Brief Narrative:  55 year old male w/ h/o PNA and sepsis for which he required right lobectomy who goes to [place] for a month yearly on a mission and education trip. He did not take any prophylaxis. On the 7/28 he felt GI distress, poor appetite and intermittent fever. He attributed these symptoms to food poisoning. He continued to feel poorly over the following days. He returned home on 7/30 and his intermittent fever, diarrhea, weakness, and dizziness continued to worsen to the point he was falling at home. He went to his PCP. A CBC was drawn and smear was positive for malaria. He was sent to the ED for further evaluation where he was found to have AKI, mild lactic acidosis, and severe thrombocytopenia. PCCM was asked to admit, and he was evaluated by ID.     Subjective: The pt feels he is much improved today, experiencing only rare difficulty w/ word finding.  He denies n/v, sob, or abdom pain.  He continues to have watery stool, but only 2-3 episodes per day.  He is alert and oriented.    Assessment & Plan:  Severe plasmodium falciparum cerebral malaria  IV Quinidine and Doxy per ID - very high level parasitemia at 26% initially - f/u counts pending (not available on weekend) - HIV negative - counseled pt on absolute need to use prophylaxis each and every time he visits endemic areas  Hemolysis / Severe thrombocytopenia  Counts are holding steady for now - cont to monitor   QTc prolonged on IV Quinidine  QTc 600 this morning - cont to monitor QTc on quinidine - d/c zofran / avoid all other potential QTc prolonging drugs as able    AKI crt has not significantly changed - keep hydrated - follow - likley due to erythrocyte sequestration interfering w/ renal microcirculation + myoglobinuria due to hemolysis   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 -  worsening - follow - keep hydrated - avoid other potential hepatotoxins   Moderate lactic acidosis  Slowly improving but not yet normalized - of note the parasites themselves produce lactate  Hypokalemia  Corrected to normal - follow   Hypomag Corrected to normal - follow   Hypophos  Approaching normal - cont to supplement and follow   Prior RLL Lobectomy no acute issues   Mild hyperglycemia  Check A1c - hypoglycemia is usually the bigger concern in severe malaria per my reading   DVT prophylaxis: SCDs Code Status: FULL CODE Family Communication: spoke w/ wife at bedside at length  Disposition Plan: SDU  Consultants:  PCCM ID  Procedures: none  Antimicrobials:  Doxycycline 8/4 > Quinidine 8/4 >  Objective: Blood pressure 91/61, pulse (!) 55, temperature 97.6 F (36.4 C), temperature source Oral, resp. rate 15, height  (1.753 m), weight 70.2 kg (154 lb 12.2 oz), SpO2 94 %.  Intake/Output Summary (Last 24 hours) at 03/20/16 0831 Last data filed at 03/20/16 0511  Gross per 24 hour  Intake           1640.6 ml  Output              950 ml  Net            690.6 ml   Filed Weights   03/18/16 1433 03/19/16 0500 03/20/16 0500  Weight: 68 kg (150 lb) 67.7 kg (149  lb 4 oz) 70.2 kg (154 lb 12.2 oz)    Examination: General: No acute respiratory distress Lungs: Clear to auscultation bilaterally without wheezes or crackles Cardiovascular: Regular rate and rhythm without murmur gallop or rub normal S1 and S2 Abdomen: Nontender, nondistended, soft, bowel sounds positive, no rebound, no ascites, no appreciable mass Extremities: No significant cyanosis, clubbing, or edema bilateral lower extremities  CBC:  Recent Labs Lab 03/18/16 1246 03/18/16 1842 03/19/16 0423 03/19/16 1542 03/20/16 0519  WBC 8.0 7.3 6.5 5.8 6.2  NEUTROABS 6.0 5.2 4.9 4.5 4.2  HGB 13.5 11.0* 11.4* 11.0* 10.6*  HCT 38.4* 32.6* 33.5* 32.0* 29.5*  MCV 86.1 86.7 87.0 86.7 84.5  PLT 28* 21* 20*  16* 26*   Basic Metabolic Panel:  Recent Labs Lab 03/18/16 1246 03/19/16 0423 03/20/16 0519  NA 136 138 136  K 3.4* 3.5 3.5  CL 99* 106 103  CO2 27 22 24   GLUCOSE 124* 128* 117*  BUN 24* 20 28*  CREATININE 1.36* 1.24 1.69*  CALCIUM 8.2* 7.2* 7.0*  MG  --  1.7 2.2  PHOS  --  1.5* 2.3*   GFR: Estimated Creatinine Clearance: 49 mL/min (by C-G formula based on SCr of 1.69 mg/dL).  Liver Function Tests:  Recent Labs Lab 03/18/16 1246 03/19/16 0423 03/20/16 0519  AST 203* 216* 346*  ALT 361* 309* 371*  ALKPHOS 62 55 47  BILITOT 6.3* 3.3* 2.6*  PROT 5.2* 4.5* 4.1*  ALBUMIN 2.9* 2.4* 2.1*   Coagulation Profile:  Recent Labs Lab 03/19/16 0423  INR 1.24    Recent Results (from the past 240 hour(s))  Blood culture (routine x 2)     Status: None (Preliminary result)   Collection Time: 03/18/16  2:50 PM  Result Value Ref Range Status   Specimen Description BLOOD LEFT ANTECUBITAL  Final   Special Requests BOTTLES DRAWN AEROBIC AND ANAEROBIC  10CC  Final   Culture NO GROWTH 1 DAY  Final   Report Status PENDING  Incomplete  Blood culture (routine x 2)     Status: None (Preliminary result)   Collection Time: 03/18/16  3:00 PM  Result Value Ref Range Status   Specimen Description BLOOD LEFT HAND  Final   Special Requests BOTTLES DRAWN AEROBIC AND ANAEROBIC  10CC  Final   Culture NO GROWTH 1 DAY  Final   Report Status PENDING  Incomplete  MRSA PCR Screening     Status: None   Collection Time: 03/18/16  6:49 PM  Result Value Ref Range Status   MRSA by PCR NEGATIVE NEGATIVE Final    Comment:        The GeneXpert MRSA Assay (FDA approved for NASAL specimens only), is one component of a comprehensive MRSA colonization surveillance program. It is not intended to diagnose MRSA infection nor to guide or monitor treatment for MRSA infections.      Scheduled Meds: . doxycycline (VIBRAMYCIN) IV  100 mg Intravenous BID  . quiNIDine (MALARIA_) MAINTENANCE DOSE  12  mg/kg Intravenous Q8H    LOS: 2 days   Lonia BloodJeffrey T. Monet North, MD [facility] Office  [phone] Pager - Text Page per Loretha StaplerAmion as per below:  On-Call/Text Page:      [email or web address]      password [email or web address]  If 7PM-7AM, please contact night-coverage [email or web address] Password [email or web address] 03/20/2016, 8:31 AM

## 2016-03-20 NOTE — Progress Notes (Signed)
Patient ID: Scott SaltsDavid W Cleverly, male   DOB: September 28, 1960, 55 y.o.   MRN: [id number]          [facility]    Date of Admission:  03/18/2016           Day 2 malaria therapy  Principal Problem:   Other severe and complicated Plasmodium falciparum malaria Active Problems:   Lactic acidosis   Thrombocytopenia (HCC)   . artemether-lumefantrine  4 tablet Oral Once  . artemether-lumefantrine  4 tablet Oral Once  . [START ON 03/21/2016] artemether-lumefantrine  4 tablet Oral BID  . phosphorus  500 mg Oral TID    SUBJECTIVE: He tells me that he is feeling much more lucid today. He has not had any further nausea or vomiting. He remains very weak.  Review of Systems: Review of Systems  Constitutional: Positive for malaise/fatigue. Negative for chills, diaphoresis, fever and weight loss.  HENT: Negative for sore throat.   Respiratory: Negative for cough, sputum production and shortness of breath.   Cardiovascular: Negative for chest pain.  Gastrointestinal: Negative for abdominal pain, diarrhea, nausea and vomiting.  Genitourinary: Negative for dysuria and frequency.  Musculoskeletal: Negative for joint pain and myalgias.  Skin: Negative for rash.  Neurological: Positive for weakness. Negative for dizziness, focal weakness and headaches.  Psychiatric/Behavioral: Negative for depression and substance abuse. The patient is not nervous/anxious.     Past Medical History:  Diagnosis Date  . Lactic acidosis 03/18/2016  . Other severe and complicated Plasmodium falciparum malaria 03/18/2016  . Pneumonia   . Sepsis (HCC)   . Thrombocytopenia (HCC) 03/18/2016    Social History  Substance Use Topics  . Smoking status: Never Smoker  . Smokeless tobacco: Never Used  . Alcohol use No    History reviewed. No pertinent family history. No Known Allergies  OBJECTIVE: Vitals:   03/20/16 0800 03/20/16 0900 03/20/16 1000 03/20/16 1220  BP: 93/62 (!) 89/60 (!) 94/59   Pulse:  (!) 55 62 63   Resp: 16 19 19    Temp:    98.9 F (37.2 C)  TempSrc:    Oral  SpO2: 93% 97% 98%   Weight:      Height:       Body mass index is 22.85 kg/m.  Physical Exam  Constitutional: He is oriented to person, place, and time.  He is alert and in no distress. He is sitting up in a chair.  HENT:  Mouth/Throat: No oropharyngeal exudate.  Eyes: Conjunctivae are normal.  Cardiovascular: Normal rate and regular rhythm.   No murmur heard. Pulmonary/Chest: Breath sounds normal.  Abdominal: Soft. He exhibits no mass. There is no tenderness.  Neurological: He is alert and oriented to person, place, and time.  His speech is normal and fluent today.  Skin: No rash noted.  Psychiatric: Mood and affect normal.    Lab Results Lab Results  Component Value Date   WBC 6.2 03/20/2016   HGB 10.6 (L) 03/20/2016   HCT 29.5 (L) 03/20/2016   MCV 84.5 03/20/2016   PLT 26 (LL) 03/20/2016    Lab Results  Component Value Date   CREATININE 1.69 (H) 03/20/2016   BUN 28 (H) 03/20/2016   NA 136 03/20/2016   K 3.5 03/20/2016   CL 103 03/20/2016   CO2 24 03/20/2016    Lab Results  Component Value Date   ALT 371 (H) 03/20/2016   AST 346 (H) 03/20/2016   ALKPHOS 47 03/20/2016   BILITOT 2.6 (H)  03/20/2016     Microbiology: Recent Results (from the past 240 hour(s))  Blood culture (routine x 2)     Status: None (Preliminary result)   Collection Time: 03/18/16  2:50 PM  Result Value Ref Range Status   Specimen Description BLOOD LEFT ANTECUBITAL  Final   Special Requests BOTTLES DRAWN AEROBIC AND ANAEROBIC  10CC  Final   Culture NO GROWTH 1 DAY  Final   Report Status PENDING  Incomplete  Blood culture (routine x 2)     Status: None (Preliminary result)   Collection Time: 03/18/16  3:00 PM  Result Value Ref Range Status   Specimen Description BLOOD LEFT HAND  Final   Special Requests BOTTLES DRAWN AEROBIC AND ANAEROBIC  10CC  Final   Culture NO GROWTH 1 DAY  Final   Report Status  PENDING  Incomplete  MRSA PCR Screening     Status: None   Collection Time: 03/18/16  6:49 PM  Result Value Ref Range Status   MRSA by PCR NEGATIVE NEGATIVE Final    Comment:        The GeneXpert MRSA Assay (FDA approved for NASAL specimens only), is one component of a comprehensive MRSA colonization surveillance program. It is not intended to diagnose MRSA infection nor to guide or monitor treatment for MRSA infections.      ASSESSMENT: He is much better after 2 days of quinidine and doxycycline. His expressive aphasia has resolved and he is now afebrile. I reviewed his peripheral blood smear with Dr. Adolphus Birchwood yesterday. He continues to have severe thrombocytopenia but his parasitemia has improved dramatically. He had parasitemia in 26% of his red blood cells on admission. This was down to 5% yesterday afternoon. He has developed progressive QTc prolongation related to quinidine. I have stopped quinidine and doxycycline and started him on Coartem to complete therapy.  PLAN: 1. Change quinidine and doxycycline to Coartem 2. Repeat blood smear in the a.m. 3. Hydration  Cliffton Asters, MD [facility] [phone] pager   [phone] cell 03/20/2016, 12:37 PM

## 2016-03-21 DIAGNOSIS — N179 Acute kidney failure, unspecified: Secondary | ICD-10-CM | POA: Diagnosis present

## 2016-03-21 DIAGNOSIS — R7401 Elevation of levels of liver transaminase levels: Secondary | ICD-10-CM | POA: Diagnosis present

## 2016-03-21 DIAGNOSIS — R4701 Aphasia: Secondary | ICD-10-CM | POA: Diagnosis present

## 2016-03-21 DIAGNOSIS — R740 Nonspecific elevation of levels of transaminase and lactic acid dehydrogenase [LDH]: Secondary | ICD-10-CM

## 2016-03-21 LAB — URINALYSIS, ROUTINE W REFLEX MICROSCOPIC
GLUCOSE, UA: NEGATIVE mg/dL
KETONES UR: NEGATIVE mg/dL
Nitrite: POSITIVE — AB
PROTEIN: 30 mg/dL — AB
Specific Gravity, Urine: 1.022 (ref 1.005–1.030)
pH: 5.5 (ref 5.0–8.0)

## 2016-03-21 LAB — COMPREHENSIVE METABOLIC PANEL
ALT: 314 U/L — AB (ref 17–63)
AST: 168 U/L — AB (ref 15–41)
Albumin: 2 g/dL — ABNORMAL LOW (ref 3.5–5.0)
Alkaline Phosphatase: 55 U/L (ref 38–126)
Anion gap: 6 (ref 5–15)
BUN: 31 mg/dL — AB (ref 6–20)
CHLORIDE: 107 mmol/L (ref 101–111)
CO2: 24 mmol/L (ref 22–32)
CREATININE: 1.81 mg/dL — AB (ref 0.61–1.24)
Calcium: 7.1 mg/dL — ABNORMAL LOW (ref 8.9–10.3)
GFR calc Af Amer: 47 mL/min — ABNORMAL LOW (ref >60)
GFR, EST NON AFRICAN AMERICAN: 40 mL/min — AB (ref >60)
Glucose, Bld: 103 mg/dL — ABNORMAL HIGH (ref 65–99)
Potassium: 3.4 mmol/L — ABNORMAL LOW (ref 3.5–5.1)
SODIUM: 137 mmol/L (ref 135–145)
Total Bilirubin: 2.2 mg/dL — ABNORMAL HIGH (ref 0.3–1.2)
Total Protein: 4.2 g/dL — ABNORMAL LOW (ref 6.5–8.1)

## 2016-03-21 LAB — LACTIC ACID, PLASMA: Lactic Acid, Venous: 1.9 mmol/L (ref 0.5–1.9)

## 2016-03-21 LAB — CBC
HEMATOCRIT: 32.9 % — AB (ref 39.0–52.0)
HEMOGLOBIN: 11 g/dL — AB (ref 13.0–17.0)
MCH: 28.8 pg (ref 26.0–34.0)
MCHC: 33.4 g/dL (ref 30.0–36.0)
MCV: 86.1 fL (ref 78.0–100.0)
Platelets: 34 10*3/uL — ABNORMAL LOW (ref 150–400)
RBC: 3.82 MIL/uL — AB (ref 4.22–5.81)
RDW: 15 % (ref 11.5–15.5)
WBC: 7.7 10*3/uL (ref 4.0–10.5)

## 2016-03-21 LAB — PARASITE EXAM SCREEN, BLOOD-W CONF TO LABCORP (NOT @ ARMC)

## 2016-03-21 LAB — LACTATE DEHYDROGENASE: LDH: 526 U/L — ABNORMAL HIGH (ref 98–192)

## 2016-03-21 LAB — URINE MICROSCOPIC-ADD ON

## 2016-03-21 LAB — SAVE SMEAR

## 2016-03-21 MED ORDER — POTASSIUM CHLORIDE CRYS ER 20 MEQ PO TBCR
40.0000 meq | EXTENDED_RELEASE_TABLET | Freq: Once | ORAL | Status: AC
  Administered 2016-03-21: 40 meq via ORAL
  Filled 2016-03-21: qty 2

## 2016-03-21 MED ORDER — ENSURE ENLIVE PO LIQD
237.0000 mL | Freq: Two times a day (BID) | ORAL | Status: DC
  Administered 2016-03-21: 237 mL via ORAL

## 2016-03-21 NOTE — Plan of Care (Signed)
Problem: Nutrition: Goal: Adequate nutrition will be maintained Outcome: Progressing Discussed the importance of hydration and eating.  Patient mentioned the ensure supplements were something he could manage when not eating well.

## 2016-03-21 NOTE — Progress Notes (Signed)
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   Antibiotics:  Anti-infectives    Start     Dose/Rate Route Frequency Ordered Stop   03/21/16 0800  artemether-lumefantrine (COARTEM) 20-120 MG tablet 4 tablet     4 tablet Oral 2 times daily 03/20/16 1154 03/23/16 0759   03/20/16 2000  artemether-lumefantrine (COARTEM) 20-120 MG tablet 4 tablet     4 tablet Oral  Once 03/20/16 1154 03/20/16 2136   03/20/16 1200  artemether-lumefantrine (COARTEM) 20-120 MG tablet 4 tablet     4 tablet Oral  Once 03/20/16 1150 03/20/16 1340   03/19/16 0200  doxycycline (VIBRAMYCIN) 100 mg in dextrose 5 % 250 mL IVPB  Status:  Discontinued     100 mg 125 mL/hr over 120 Minutes Intravenous 2 times daily 03/19/16 0020 03/20/16 1056   03/18/16 1500  doxycycline (VIBRAMYCIN) 100 mg in dextrose 5 % 250 mL IVPB     100 mg 125 mL/hr over 120 Minutes Intravenous  Once 03/18/16 1430 03/18/16 1752      Medications: Scheduled Meds: . artemether-lumefantrine  4 tablet Oral BID  . feeding supplement (ENSURE ENLIVE)  237 mL Oral BID BM  . phosphorus  500 mg Oral TID   Continuous Infusions: . sodium chloride 75 mL/hr at 03/20/16 1800   PRN Meds:.acetaminophen, promethazine    Objective: Weight change: 5 lb 4.7 oz (2.4 kg)  Intake/Output Summary (Last 24 hours) at 03/21/16 1753 Last data filed at 03/21/16 1500  Gross per 24 hour  Intake           2817.5 ml  Output              400 ml  Net           2417.5 ml   Blood pressure 112/66, pulse 61, temperature 98.5 F (36.9 C), temperature source Oral, resp. rate 14, height 5\' 9"  (1.753 m), weight 158 lb 4.6 oz (71.8 kg), SpO2 95 %. Temp:  [97.5 F (36.4 C)-99.8 F (37.7 C)] 98.5 F (36.9 C) (08/07 1600) Pulse Rate:  [51-81] 61 (08/07 1600) Resp:  [10-20] 14 (08/07 1600) BP: (80-112)/(51-74) 112/66 (08/07 1600) SpO2:  [93 %-98 %] 95 % (08/07 1600) Weight:  [158 lb 4.6 oz (71.8 kg)-160  lb 0.9 oz (72.6 kg)] 158 lb 4.6 oz (71.8 kg) (08/07 0436)  Physical Exam: General: Alert and awake, oriented x3, not in any acute distress, fatigued HEENT: anicteric sclera, pupils reactive to light and accommodation, EOMI CVS regular rate, normal r,  no murmur rubs or gallops Chest: clear to auscultation bilaterally, no wheezing, rales or rhonchi Abdomen: soft nontender, nondistended, normal bowel sounds, Extremities: no  clubbing or edema noted bilaterally Skin: no rashes Neuro: nonfocal  CBC: CBC Latest Ref Rng & Units 03/21/2016 03/20/2016 03/19/2016  WBC 4.0 - 10.5 K/uL 7.7 6.2 5.8  Hemoglobin 13.0 - 17.0 g/dL 11.0(L) 10.6(L) 11.0(L)  Hematocrit 39.0 - 52.0 % 32.9(L) 29.5(L) 32.0(L)  Platelets 150 - 400 K/uL 34(L) 26(LL) 16(LL)      BMET  Recent Labs  03/20/16 0519 03/21/16 0423  NA 136 137  K 3.5 3.4*  CL 103 107  CO2 24 24  GLUCOSE 117* 103*  BUN 28* 31*  CREATININE 1.69* 1.81*  CALCIUM 7.0* 7.1*     Liver Panel   Recent Labs  03/20/16 0519 03/21/16 0423  PROT 4.1* 4.2*  ALBUMIN 2.1* 2.0*  AST  346* 168*  ALT 371* 314*  ALKPHOS 47 55  BILITOT 2.6* 2.2*       Sedimentation Rate No results for input(s): ESRSEDRATE in the last 72 hours. C-Reactive Protein No results for input(s): CRP in the last 72 hours.  Micro Results: Recent Results (from the past 720 hour(s))  Blood culture (routine x 2)     Status: None (Preliminary result)   Collection Time: 03/18/16  2:50 PM  Result Value Ref Range Status   Specimen Description BLOOD LEFT ANTECUBITAL  Final   Special Requests BOTTLES DRAWN AEROBIC AND ANAEROBIC  10CC  Final   Culture NO GROWTH 3 DAYS  Final   Report Status PENDING  Incomplete  Blood culture (routine x 2)     Status: None (Preliminary result)   Collection Time: 03/18/16  3:00 PM  Result Value Ref Range Status   Specimen Description BLOOD LEFT HAND  Final   Special Requests BOTTLES DRAWN AEROBIC AND ANAEROBIC  10CC  Final   Culture NO  GROWTH 3 DAYS  Final   Report Status PENDING  Incomplete  MRSA PCR Screening     Status: None   Collection Time: 03/18/16  6:49 PM  Result Value Ref Range Status   MRSA by PCR NEGATIVE NEGATIVE Final    Comment:        The GeneXpert MRSA Assay (FDA approved for NASAL specimens only), is one component of a comprehensive MRSA colonization surveillance program. It is not intended to diagnose MRSA infection nor to guide or monitor treatment for MRSA infections.     Studies/Results: No results found.    Assessment/Plan:  INTERVAL HISTORY:    Aphasia resolved   Principal Problem:   Other severe and complicated Plasmodium falciparum malaria Active Problems:   Lactic acidosis   Thrombocytopenia (HCC)    Scott Leonard is a 55 y.o. male with  SEVERE Plasmodium falciparum malaria High-grade parasitemia and likely cerebral involvement with confusion and aphasia both now having resolved. Patient also has acute kidney injury which is still not yet resolved.  #1 Severe Plasmodium falciparum malaria I: Patient's course has been complicated by likely cerebral involvement as well as acute kidney injury as well as quinidine toxicity with QT prolongation. Patient is now off clonidine and doxycycline and is completing a course of co-artem  The formal % parasitemia and ID are not yet back ( I frankly think this turn around is fairly poor esp for ID of an organism fortunately it had already been ID by outside lab from PCP office whose lab has faster turnaround than ours (at least on weekend)  He is fortunately doing well now apart from the AKI  Complete coartem therapy  Continue supportive care  I will return to see him in the am and then will sign off   LOS: 3 days   Acey Lav 03/21/2016, 5:53 PM

## 2016-03-21 NOTE — Progress Notes (Signed)
Initial Nutrition Assessment  DOCUMENTATION CODES:   Not applicable  INTERVENTION:    Ensure Enlive po BID, each supplement provides 350 kcal and 20 grams of protein  NUTRITION DIAGNOSIS:   Increased nutrient needs related to acute illness as evidenced by estimated needs  GOAL:   Patient will meet greater than or equal to 90% of their needs  MONITOR:   PO intake, Supplement acceptance, Labs, Weight trends, I & O's  REASON FOR ASSESSMENT:   Malnutrition Screening Tool  ASSESSMENT:   55 year old Male w/ only sig medical h/o PNA and sepsis for which he required prior right lobectomy in past. [place] mission and education trip for 1 mo. Did not take any prophylaxis. On the 28th had some sort of egg salad meal which had been out for some time and shortly after that he felt some GI distress, poor appetite and intermittent fever. He attributed these symptoms to food poisoning. He continued to feel poorly over the following days. He returned home on 7/30 and his symptoms of intermittent fever, diarrhea and eventually weakness and dizziness continued to worsen to point he was falling at home. He went to his PCP. A CBC was drawn and smear was positive for malaria.  Spoke with pt and pt's wife at bedside. Pt reports he was experiencing a poor appetite PTA along with diarrhea. PO intake still variable per pt's wife >> no % available per flowsheets records. Would benefit from addition of oral nutrition supplements. Amenable to chocolate Ensure Enlive >> will order. No muscle or subcutaneous fat depletion noticed.  Diet Order:  Diet regular Room service appropriate? Yes; Fluid consistency: Thin  Skin:  Reviewed, no issues  Last BM:  8/7  Height:   Ht Readings from Last 1 Encounters:  03/20/16 5\' 9"  (1.753 m)    Weight:   Wt Readings from Last 1 Encounters:  03/21/16 158 lb 4.6 oz (71.8 kg)    Ideal Body Weight:  72.7 kg  BMI:  Body mass index is 23.38  kg/m.  Estimated Nutritional Needs:   Kcal:  1800-2000  Protein:  90-100 gm  Fluid:  1.8-2.0 L  EDUCATION NEEDS:   No education needs identified at this time  Maureen ChattersKatie Senta Kantor, RD, LDN Pager #: [phone] After-Hours Pager #: [phone]

## 2016-03-21 NOTE — Progress Notes (Signed)
Informed on-call MD about urinalysis results earlier in the shift.

## 2016-03-21 NOTE — Progress Notes (Signed)
Scott Leonard - Stepdown/ICU TEAM  Scott Leonard  ZOX:[id number] DOB: 02/04/1961 DOA: 03/18/2016 PCP: No PCP Per Patient    Brief Narrative:  55 year old male w/ h/o PNA and sepsis for which he required right lobectomy who [place] yearly on a mission and education trip. He did not take any prophylaxis. On the 7/28 he felt GI distress, poor appetite and intermittent fever. He attributed these symptoms to food poisoning. He continued to feel poorly over the following days. He returned home on 7/30 and his intermittent fever, diarrhea, weakness, and dizziness continued to worsen to the point he was falling at home. He went to his PCP. A CBC was drawn and smear was positive for malaria. He was sent to the ED for further evaluation where he was found to have AKI, mild lactic acidosis, and severe thrombocytopenia. PCCM was asked to admit, and he was evaluated by ID.     Subjective:  The pt is frustrated that he is feeling tired and weak.  He denies cp, sob, diarrhe, n/v, or abdom pain.  He is clear headed and feels his speech is fully recovered at this time.   Assessment & Plan:  Severe plasmodium falciparum cerebral malaria  Medical tx per ID - very high level parasitemia at 26% initially - f/u counts signif improved - HIV negative - counseled pt on absolute need to use prophylaxis each and every time he visits endemic areas  Hemolysis / Severe thrombocytopenia  Hemoglobin and platelet counts beginning to climb - cont to monitor   QTc prolonged on IV Quinidine  QTc improved to 580 this morning - cont to monitor - now off quinidine should cont to improve    AKI crt has been slowly climbing - increase IVF rate today - follow - likley due to erythrocyte sequestration interfering w/ renal microcirculation + myoglobinuria due to hemolysis   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 - has improved somewhat today - follow - keep hydrated - avoid other potential  hepatotoxins   Moderate lactic acidosis  Lactic acid has now normalized - of note the parasites themselves produce lactate  Hypokalemia  Potassium drifting down again  - replace and follow   Hypomag Corrected to normal   Hypophos  Approaching normal - recheck in a.m.   Prior RLL Lobectomy no acute issues   Mild hyperglycemia  Check A1c - hypoglycemia is usually the bigger concern in severe malaria per my reading   DVT prophylaxis: SCDs Code Status: FULL CODE Family Communication: spoke w/ wife at bedside  Disposition Plan: SDU  Consultants:  PCCM ID  Procedures: none  Antimicrobials:  Doxycycline 8/4 > 8/5 Quinidine 8/4 > 8/5  Objective: Leonard pressure 106/72, pulse (!) 58, temperature 98.8 F (37.Leonard C), temperature source Oral, resp. rate 16, height 5\' 9"  (Leonard.753 m), weight 71.8 kg (158 lb 4.6 oz), SpO2 95 %.  Intake/Output Summary (Last 24 hours) at 03/21/16 1204 Last data filed at 03/21/16 1000  Gross per 24 hour  Intake             2130 ml  Output              900 ml  Net             1230 ml   Filed Weights   03/20/16 0500 03/20/16 1830 03/21/16 0436  Weight: 70.2 kg (154 lb 12.2 oz) 72.6 kg (160 lb 0.9 oz) 71.8 kg (158 lb  4.6 oz)    Examination: General: No acute respiratory distress Lungs: Clear to auscultation bilaterally - no wheezes or crackles Cardiovascular: Regular rate and rhythm without murmur gallop or rub  Abdomen: Nontender, nondistended, soft, bowel sounds positive, no rebound, no ascites, no appreciable mass Extremities: No significant cyanosis, clubbing, edema bilateral lower extremities  CBC:  Recent Labs Lab 03/18/16 1246 03/18/16 1842 03/19/16 0423 03/19/16 1542 03/20/16 0519 03/21/16 0423  WBC 8.0 7.3 6.5 5.8 6.2 7.7  NEUTROABS 6.0 5.2 4.9 4.5 4.2  --   HGB 13.5 11.0* 11.4* 11.0* 10.6* 11.0*  HCT 38.4* 32.6* 33.5* 32.0* 29.5* 32.9*  MCV 86.Leonard 86.7 87.0 86.7 84.5 86.Leonard  PLT 28* 21* 20* 16* 26* 34*   Basic Metabolic  Panel:  Recent Labs Lab 03/18/16 1246 03/19/16 0423 03/20/16 0519 03/21/16 0423  NA 136 138 136 137  K 3.4* 3.5 3.5 3.4*  CL 99* 106 103 107  CO2 GLUCOSE 124* 128* 117* 103*  BUN 24* 20 28* 31*  CREATININE Leonard.36* Leonard.24 Leonard.69* Leonard.81*  CALCIUM 8.2* 7.2* 7.0* 7.Leonard*  MG  --  Leonard.7 2.2  --   PHOS  --  Leonard.5* 2.3*  --    GFR: Estimated Creatinine Clearance: 46.Leonard mL/min (by C-G formula based on SCr of Leonard.81 mg/dL).  Liver Function Tests:  Recent Labs Lab 03/18/16 1246 03/19/16 0423 03/20/16 0519 03/21/16 0423  AST 203* 216* 346* 168*  ALT 361* 309* 371* 314*  ALKPHOS 62 55 47 55  BILITOT 6.3* 3.3* 2.6* 2.2*  PROT 5.2* 4.5* 4.Leonard* 4.2*  ALBUMIN 2.9* 2.4* 2.Leonard* 2.0*   Coagulation Profile:  Recent Labs Lab 03/19/16 0423  INR Leonard.24    Recent Results (from the past 240 hour(s))  Leonard culture (routine x 2)     Status: None (Preliminary result)   Collection Time: 03/18/16  2:50 PM  Result Value Ref Range Status   Specimen Description Leonard LEFT ANTECUBITAL  Final   Special Requests BOTTLES DRAWN AEROBIC AND ANAEROBIC  10CC  Final   Culture NO GROWTH 2 DAYS  Final   Report Status PENDING  Incomplete  Leonard culture (routine x 2)     Status: None (Preliminary result)   Collection Time: 03/18/16  3:00 PM  Result Value Ref Range Status   Specimen Description Leonard LEFT HAND  Final   Special Requests BOTTLES DRAWN AEROBIC AND ANAEROBIC  10CC  Final   Culture NO GROWTH 2 DAYS  Final   Report Status PENDING  Incomplete  MRSA PCR Screening     Status: None   Collection Time: 03/18/16  6:49 PM  Result Value Ref Range Status   MRSA by PCR NEGATIVE NEGATIVE Final    Comment:        The GeneXpert MRSA Assay (FDA approved for NASAL specimens only), is one component of a comprehensive MRSA colonization surveillance program. It is not intended to diagnose MRSA infection nor to guide or monitor treatment for MRSA infections.      Scheduled Meds: .  artemether-lumefantrine  4 tablet Oral BID  . phosphorus  500 mg Oral TID    LOS: 3 days   Scott Blood, MD [facility] Office  [phone] Pager - Text Page per Scott Leonard as per below:  On-Call/Text Page:      [email or web address]      password [email or web address]  If 7PM-7AM, please contact night-coverage [email or web address] Password [email or web address] 03/21/2016, 12:04 PM

## 2016-03-22 DIAGNOSIS — R7402 Elevation of levels of lactic acid dehydrogenase (LDH): Secondary | ICD-10-CM | POA: Diagnosis present

## 2016-03-22 DIAGNOSIS — R17 Unspecified jaundice: Secondary | ICD-10-CM

## 2016-03-22 DIAGNOSIS — R740 Nonspecific elevation of levels of transaminase and lactic acid dehydrogenase [LDH]: Secondary | ICD-10-CM

## 2016-03-22 DIAGNOSIS — N179 Acute kidney failure, unspecified: Secondary | ICD-10-CM

## 2016-03-22 DIAGNOSIS — B500 Plasmodium falciparum malaria with cerebral complications: Secondary | ICD-10-CM

## 2016-03-22 LAB — CBC WITH DIFFERENTIAL/PLATELET
BASOS ABS: 0.1 10*3/uL (ref 0.0–0.1)
Basophils Relative: 1 %
Eosinophils Absolute: 0.1 10*3/uL (ref 0.0–0.7)
Eosinophils Relative: 2 %
HCT: 32 % — ABNORMAL LOW (ref 39.0–52.0)
Hemoglobin: 11 g/dL — ABNORMAL LOW (ref 13.0–17.0)
LYMPHS ABS: 0.6 10*3/uL — AB (ref 0.7–4.0)
Lymphocytes Relative: 10 %
MCH: 29.8 pg (ref 26.0–34.0)
MCHC: 34.4 g/dL (ref 30.0–36.0)
MCV: 86.7 fL (ref 78.0–100.0)
MONO ABS: 0.5 10*3/uL (ref 0.1–1.0)
Monocytes Relative: 8 %
NEUTROS PCT: 79 %
Neutro Abs: 4.5 10*3/uL (ref 1.7–7.7)
PLATELETS: 16 10*3/uL — AB (ref 150–400)
RBC: 3.69 MIL/uL — AB (ref 4.22–5.81)
RDW: 14.2 % (ref 11.5–15.5)
WBC: 5.8 10*3/uL (ref 4.0–10.5)

## 2016-03-22 LAB — COMPREHENSIVE METABOLIC PANEL
ALBUMIN: 1.7 g/dL — AB (ref 3.5–5.0)
ALK PHOS: 61 U/L (ref 38–126)
ALT: 220 U/L — ABNORMAL HIGH (ref 17–63)
ANION GAP: 7 (ref 5–15)
AST: 120 U/L — AB (ref 15–41)
BILIRUBIN TOTAL: 1.4 mg/dL — AB (ref 0.3–1.2)
BUN: 22 mg/dL — AB (ref 6–20)
CALCIUM: 7.2 mg/dL — AB (ref 8.9–10.3)
CO2: 24 mmol/L (ref 22–32)
Chloride: 106 mmol/L (ref 101–111)
Creatinine, Ser: 1.59 mg/dL — ABNORMAL HIGH (ref 0.61–1.24)
GFR calc Af Amer: 55 mL/min — ABNORMAL LOW (ref >60)
GFR calc non Af Amer: 47 mL/min — ABNORMAL LOW (ref >60)
GLUCOSE: 102 mg/dL — AB (ref 65–99)
Potassium: 3.3 mmol/L — ABNORMAL LOW (ref 3.5–5.1)
SODIUM: 137 mmol/L (ref 135–145)
Total Protein: 3.8 g/dL — ABNORMAL LOW (ref 6.5–8.1)

## 2016-03-22 LAB — LIPID PANEL
CHOLESTEROL: 101 mg/dL (ref 0–200)
HDL: <10 mg/dL — ABNORMAL LOW (ref >40)
Triglycerides: 369 mg/dL — ABNORMAL HIGH (ref <150)
VLDL: 74 mg/dL — AB (ref 0–40)

## 2016-03-22 LAB — LACTATE DEHYDROGENASE: LDH: 430 U/L — ABNORMAL HIGH (ref 98–192)

## 2016-03-22 LAB — SAVE SMEAR

## 2016-03-22 LAB — PHOSPHORUS: PHOSPHORUS: 2.9 mg/dL (ref 2.5–4.6)

## 2016-03-22 MED ORDER — POTASSIUM CHLORIDE CRYS ER 20 MEQ PO TBCR
50.0000 meq | EXTENDED_RELEASE_TABLET | Freq: Once | ORAL | Status: AC
  Administered 2016-03-22: 50 meq via ORAL
  Filled 2016-03-22: qty 2

## 2016-03-22 NOTE — Progress Notes (Signed)
PROGRESS NOTE    Scott Leonard  ZOX:[id number] DOB: 23-Feb-1961 DOA: 03/18/2016 PCP: No PCP Per Patient   Brief Narrative:  55 year old WM PMHx  PNA and sepsis for which he required right lobectomy   who goes to [place] for a month yearly on a mission and education trip. He did not take any prophylaxis. On the 7/28 he felt GI distress, poor appetite and intermittent fever. He attributed these symptoms to food poisoning. He continued to feel poorly over the following days. He returned home on 7/30 and his intermittent fever, diarrhea, weakness, and dizziness continued to worsen to the point he was falling at home. He went to his PCP. A CBC was drawn and smear was positive for malaria. He was sent to the ED for further evaluation where he was found to have AKI, mild lactic acidosis, and severe thrombocytopenia. PCCM was asked to admit, and he was evaluated by ID.       Subjective: 8/8 A/O 4, states did not take malaria medication no excuse. Have been after code in different spots without taking prior but understands she should have on this occasion.      Assessment & Plan:   Principal Problem:   Other severe and complicated Plasmodium falciparum malaria Active Problems:   Lactic acidosis   Thrombocytopenia (HCC)   AKI (acute kidney injury) (HCC)   Transaminitis   Expressive aphasia   Elevated bilirubin   Elevated LDH   Severe plasmodium falciparum cerebral malaria  -Medical tx per ID - very high level parasitemia at 26% initially - f/u counts signif improved  - HIV negative  - counseled pt on absolute need to use prophylaxis each and every time he visits endemic areas: Dr. Luciana Axe ID also reinforced that he must follow-up in trauma clinic prior to leaving the [place] in the future,  Hemolysis / Severe thrombocytopenia  Recent Labs Lab 03/18/16 1842 03/19/16 0423 03/19/16 1542 03/20/16 0519 03/21/16 0423  HGB 11.0* 11.4* 11.0* 10.6* 11.0*  -Continue to monitor  hemoglobin and platelet count.    QTc prolonged on IV Quinidine  -QTc improved to 580 this morning - cont to monitor - now off quinidine should cont to improve  -Repeat EKG on 8/9  AKI -Slowly improving   Lab Results  Component Value Date   CREATININE 1.59 (H) 03/22/2016   CREATININE 1.81 (H) 03/21/2016   CREATININE 1.69 (H) 03/20/2016  -Continue normal saline 149ml/hr  Transaminitis -Due to malaria induced hepatocyte injury  - LE still elevated but Improving  Hypokalemia -K Dur 50 mEq 1    Hypomag Corrected to normal   Hypophos  -Resolved    Prior RLL Lobectomy no acute issues   Mild hyperglycemia  -Check A1c: Pending  - hypoglycemia is usually the bigger concern in severe malaria per my reading    DVT prophylaxis: SCD  Code Status: Full Family Communication: Wife present at bedside Disposition Plan: Per infectious disease   Consultants:  PCCM ID  Procedures/Significant Events:  None  Cultures 8/4 blood left AC/hand NGTD 8/4 MRSA by PCR negative 8/4 blood positive Plasmodium falciparum malaria 8/5 blood positive Plasmodium falciparum malaria   Antimicrobials: Doxycycline 8/4 > 8/5 Quinidine 8/4 > 8/5   Devices    LINES / TUBES:      Continuous Infusions: . sodium chloride 125 mL/hr at 03/22/16 0539     Objective: Vitals:   03/22/16 0400 03/22/16 0500 03/22/16 0600 03/22/16 0800  BP: 107/66 109/68 109/71   Pulse: Marland Kitchen)  50 (!) 49 (!) 47   Resp: 17 16 17    Temp:    98.1 F (36.7 C)  TempSrc:    Oral  SpO2: 95% 97% 97%   Weight:      Height:        Intake/Output Summary (Last 24 hours) at 03/22/16 1224 Last data filed at 03/22/16 0600  Gross per 24 hour  Intake          2619.17 ml  Output              375 ml  Net          2244.17 ml   Filed Weights   03/20/16 1830 03/21/16 0436 03/22/16 0306  Weight: 72.6 kg (160 lb 0.9 oz) 71.8 kg (158 lb 4.6 oz) 76 kg (167 lb 8.8 oz)    Examination:  General: A/O 4, NAD, No  acute respiratory distress Eyes: negative scleral hemorrhage, negative anisocoria, negative icterus ENT: Negative Runny nose, negative gingival bleeding, Neck:  Negative scars, masses, torticollis, lymphadenopathy, JVD Lungs: negative breath sounds right lung fields, fourth rib missing, left lung fields clear to auscultation negative wheezes or crackles Cardiovascular: Regular rate and rhythm without murmur gallop or rub normal S1 and S2 Abdomen: negative abdominal pain, nondistended, positive soft, bowel sounds, no rebound, no ascites, no appreciable mass Extremities: No significant cyanosis, clubbing, or edema bilateral lower extremities Skin: Negative rashes, lesions, ulcers Psychiatric:  Negative depression, negative anxiety, negative fatigue, negative mania  Central nervous system:  Cranial nerves II through XII intact, tongue/uvula midline, all extremities muscle strength 5/5, sensation intact throughout, negative dysarthria, negative expressive aphasia, negative receptive aphasia.  .     Data Reviewed: Care during the described time interval was provided by me .  I have reviewed this patient's available data, including medical history, events of note, physical examination, and all test results as part of my evaluation. I have personally reviewed and interpreted all radiology studies.  CBC:  Recent Labs Lab 03/18/16 1246 03/18/16 1842 03/19/16 0423 03/19/16 1542 03/20/16 0519 03/21/16 0423  WBC 8.0 7.3 6.5 5.8 6.2 7.7  NEUTROABS 6.0 5.2 4.9 4.5 4.2  --   HGB 13.5 11.0* 11.4* 11.0* 10.6* 11.0*  HCT 38.4* 32.6* 33.5* 32.0* 29.5* 32.9*  MCV 86.1 86.7 87.0 86.7 84.5 86.1  PLT 28* 21* 20* 16* 26* 34*   Basic Metabolic Panel:  Recent Labs Lab 03/18/16 1246 03/19/16 0423 03/20/16 0519 03/21/16 0423 03/22/16 0453  NA 136 138 136 137 137  K 3.4* 3.5 3.5 3.4* 3.3*  CL 99* 106 103 107 106  CO2 27 22 24 24 24   GLUCOSE 124* 128* 117* 103* 102*  BUN 24* 20 28* 31* 22*    CREATININE 1.36* 1.24 1.69* 1.81* 1.59*  CALCIUM 8.2* 7.2* 7.0* 7.1* 7.2*  MG  --  1.7 2.2  --   --   PHOS  --  1.5* 2.3*  --  2.9   GFR: Estimated Creatinine Clearance: 52.5 mL/min (by C-G formula based on SCr of 1.59 mg/dL). Liver Function Tests:  Recent Labs Lab 03/18/16 1246 03/19/16 0423 03/20/16 0519 03/21/16 0423 03/22/16 0453  AST 203* 216* 346* 168* 120*  ALT 361* 309* 371* 314* 220*  ALKPHOS 62 55 47 55 61  BILITOT 6.3* 3.3* 2.6* 2.2* 1.4*  PROT 5.2* 4.5* 4.1* 4.2* 3.8*  ALBUMIN 2.9* 2.4* 2.1* 2.0* 1.7*   No results for input(s): LIPASE, AMYLASE in the last 168 hours. No results for input(s): AMMONIA in  the last 168 hours. Coagulation Profile:  Recent Labs Lab 03/19/16 0423  INR 1.24   Cardiac Enzymes: No results for input(s): CKTOTAL, CKMB, CKMBINDEX, TROPONINI in the last 168 hours. BNP (last 3 results) No results for input(s): PROBNP in the last 8760 hours. HbA1C: No results for input(s): HGBA1C in the last 72 hours. CBG:  Recent Labs Lab 03/19/16 2007  GLUCAP 140*   Lipid Profile: No results for input(s): CHOL, HDL, LDLCALC, TRIG, CHOLHDL, LDLDIRECT in the last 72 hours. Thyroid Function Tests: No results for input(s): TSH, T4TOTAL, FREET4, T3FREE, THYROIDAB in the last 72 hours. Anemia Panel: No results for input(s): VITAMINB12, FOLATE, FERRITIN, TIBC, IRON, RETICCTPCT in the last 72 hours. Urine analysis:    Component Value Date/Time   COLORURINE AMBER (A) 03/20/2016 2254   APPEARANCEUR TURBID (A) 03/20/2016 2254   LABSPEC 1.022 03/20/2016 2254   PHURINE 5.5 03/20/2016 2254   GLUCOSEU NEGATIVE 03/20/2016 2254   HGBUR MODERATE (A) 03/20/2016 2254   BILIRUBINUR SMALL (A) 03/20/2016 2254   KETONESUR NEGATIVE 03/20/2016 2254   PROTEINUR 30 (A) 03/20/2016 2254   NITRITE POSITIVE (A) 03/20/2016 2254   LEUKOCYTESUR TRACE (A) 03/20/2016 2254   Sepsis Labs: (procalcitonin:4,lacticidven:4)  ) Recent Results (from the past 240  hour(s))  Blood culture (routine x 2)     Status: None (Preliminary result)   Collection Time: 03/18/16  2:50 PM  Result Value Ref Range Status   Specimen Description BLOOD LEFT ANTECUBITAL  Final   Special Requests BOTTLES DRAWN AEROBIC AND ANAEROBIC  10CC  Final   Culture NO GROWTH 4 DAYS  Final   Report Status PENDING  Incomplete  Blood culture (routine x 2)     Status: None (Preliminary result)   Collection Time: 03/18/16  3:00 PM  Result Value Ref Range Status   Specimen Description BLOOD LEFT HAND  Final   Special Requests BOTTLES DRAWN AEROBIC AND ANAEROBIC  10CC  Final   Culture NO GROWTH 4 DAYS  Final   Report Status PENDING  Incomplete  MRSA PCR Screening     Status: None   Collection Time: 03/18/16  6:49 PM  Result Value Ref Range Status   MRSA by PCR NEGATIVE NEGATIVE Final    Comment:        The GeneXpert MRSA Assay (FDA approved for NASAL specimens only), is one component of a comprehensive MRSA colonization surveillance program. It is not intended to diagnose MRSA infection nor to guide or monitor treatment for MRSA infections.          Radiology Studies: No results found.      Scheduled Meds: . artemether-lumefantrine  4 tablet Oral BID  . feeding supplement (ENSURE ENLIVE)  237 mL Oral BID BM   Continuous Infusions: . sodium chloride 125 mL/hr at 03/22/16 0539     LOS: 4 days    Time spent: 40 minutes    Darcus Edds, Roselind Messier, MD [facility] Pager [phone]   If 7PM-7AM, please contact night-coverage [email or web address] Password [facility] 03/22/2016, 12:24 PM

## 2016-03-22 NOTE — Progress Notes (Signed)
Subjective:  Feels much better c/o edema   Antibiotics:  Anti-infectives    Start     Dose/Rate Route Frequency Ordered Stop   03/21/16 0800  artemether-lumefantrine (COARTEM) 20-120 MG tablet 4 tablet     4 tablet Oral 2 times daily 03/20/16 1154 03/23/16 0759   03/20/16 2000  artemether-lumefantrine (COARTEM) 20-120 MG tablet 4 tablet     4 tablet Oral  Once 03/20/16 1154 03/20/16 2136   03/20/16 1200  artemether-lumefantrine (COARTEM) 20-120 MG tablet 4 tablet     4 tablet Oral  Once 03/20/16 1150 03/20/16 1340   03/19/16 0200  doxycycline (VIBRAMYCIN) 100 mg in dextrose 5 % 250 mL IVPB  Status:  Discontinued     100 mg 125 mL/hr over 120 Minutes Intravenous 2 times daily 03/19/16 0020 03/20/16 1056   03/18/16 1500  doxycycline (VIBRAMYCIN) 100 mg in dextrose 5 % 250 mL IVPB     100 mg 125 mL/hr over 120 Minutes Intravenous  Once 03/18/16 1430 03/18/16 1752      Medications: Scheduled Meds: . artemether-lumefantrine  4 tablet Oral BID  . feeding supplement (ENSURE ENLIVE)  237 mL Oral BID BM   Continuous Infusions: . sodium chloride 125 mL/hr at 03/22/16 0539   PRN Meds:.acetaminophen, promethazine    Objective: Weight change: 7 lb 7.9 oz (3.4 kg)  Intake/Output Summary (Last 24 hours) at 03/22/16 1607 Last data filed at 03/22/16 1400  Gross per 24 hour  Intake          3399.17 ml  Output              375 ml  Net          3024.17 ml   Blood pressure 114/65, pulse (!) 50, temperature 98.7 F (37.1 C), temperature source Oral, resp. rate 16, height  (1.753 m), weight 167 lb 6.4 oz (75.9 kg), SpO2 98 %. Temp:  [98.1 F (36.7 C)-99 F (37.2 C)] 98.7 F (37.1 C) (08/08 1456) Pulse Rate:  [47-58] 50 (08/08 1456) Resp:  [14-21] 16 (08/08 1456) BP: (95-114)/(58-73) 114/65 (08/08 1456) SpO2:  [94 %-98 %] 98 % (08/08 1456) Weight:  [167 lb 6.4 oz (75.9 kg)-167 lb 8.8 oz (76 kg)] 167 lb 6.4 oz (75.9 kg) (08/08 1440)  Physical Exam: General: Alert  and awake, oriented x3, not in any acute distress, fatigued HEENT: anicteric sclera, pupils reactive to light and accommodation, EOMI CVS regular rate, normal r,  no murmur rubs or gallops Chest: clear to auscultation bilaterally, no wheezing, rales or rhonchi Abdomen: soft nontender, nondistended, normal bowel sounds, Extremities: no  clubbing or edema noted bilaterally Skin: no rashes Neuro: nonfocal  CBC: CBC Latest Ref Rng & Units 03/21/2016 03/20/2016 03/19/2016  WBC 4.0 - 10.5 K/uL 7.7 6.2 5.8  Hemoglobin 13.0 - 17.0 g/dL 11.0(L) 10.6(L) 11.0(L)  Hematocrit 39.0 - 52.0 % 32.9(L) 29.5(L) 32.0(L)  Platelets 150 - 400 K/uL 34(L) 26(LL) 16(LL)      BMET  Recent Labs  03/21/16 0423 03/22/16 0453  NA 137 137  K 3.4* 3.3*  CL 107 106  CO2 24 24  GLUCOSE 103* 102*  BUN 31* 22*  CREATININE 1.81* 1.59*  CALCIUM 7.1* 7.2*     Liver Panel   Recent Labs  03/21/16 0423 03/22/16 0453  PROT 4.2* 3.8*  ALBUMIN 2.0* 1.7*  AST 168* 120*  ALT 314* 220*  ALKPHOS 55 61  BILITOT 2.2* 1.4*  Sedimentation Rate No results for input(s): ESRSEDRATE in the last 72 hours. C-Reactive Protein No results for input(s): CRP in the last 72 hours.  Micro Results: Recent Results (from the past 720 hour(s))  Blood culture (routine x 2)     Status: None (Preliminary result)   Collection Time: 03/18/16  2:50 PM  Result Value Ref Range Status   Specimen Description BLOOD LEFT ANTECUBITAL  Final   Special Requests BOTTLES DRAWN AEROBIC AND ANAEROBIC  10CC  Final   Culture NO GROWTH 4 DAYS  Final   Report Status PENDING  Incomplete  Blood culture (routine x 2)     Status: None (Preliminary result)   Collection Time: 03/18/16  3:00 PM  Result Value Ref Range Status   Specimen Description BLOOD LEFT HAND  Final   Special Requests BOTTLES DRAWN AEROBIC AND ANAEROBIC  10CC  Final   Culture NO GROWTH 4 DAYS  Final   Report Status PENDING  Incomplete  MRSA PCR Screening     Status: None    Collection Time: 03/18/16  6:49 PM  Result Value Ref Range Status   MRSA by PCR NEGATIVE NEGATIVE Final    Comment:        The GeneXpert MRSA Assay (FDA approved for NASAL specimens only), is one component of a comprehensive MRSA colonization surveillance program. It is not intended to diagnose MRSA infection nor to guide or monitor treatment for MRSA infections.     Studies/Results: No results found.    Assessment/Plan:  INTERVAL HISTORY:    Aphasia resolved   Principal Problem:   Other severe and complicated Plasmodium falciparum malaria Active Problems:   Lactic acidosis   Thrombocytopenia (HCC)   AKI (acute kidney injury) (HCC)   Transaminitis   Expressive aphasia   Elevated bilirubin   Elevated LDH    Scott Leonard is a 55 y.o. male with  SEVERE Plasmodium falciparum malaria High-grade parasitemia and likely cerebral involvement with confusion and aphasia both now having resolved. Patient also has acute kidney injury which is still not yet resolved.  #1 Severe Plasmodium falciparum malaria I: Patient's course has been complicated by likely cerebral involvement as well as acute kidney injury as well as quinidine toxicity with QT prolongation. Patient is now off clonidine and doxycycline and is completing a course of co-artem  The formal % parasitemia and ID are not yet back ( I frankly think this turn around is fairly poor esp for ID of an organism fortunately it had already been ID by outside lab from PCP office whose lab has faster turnaround than ours (at least on weekend)  He is fortunately doing well now  And even AKI resolving Complete coartem therapy  Continue supportive care  I will sign off for now  He should come to [facility] to see Dr. Luciana Axeomer or Dr. Drue SecondSnider in October as part of PRE-Travel preparation for his next [place] trip.    LOS: 4 days   Scott Leonard 03/22/2016, 4:07 PM

## 2016-03-23 ENCOUNTER — Inpatient Hospital Stay (HOSPITAL_COMMUNITY): Payer: BC Managed Care – PPO

## 2016-03-23 LAB — MAGNESIUM: Magnesium: 1.8 mg/dL (ref 1.7–2.4)

## 2016-03-23 LAB — COMPREHENSIVE METABOLIC PANEL
ALBUMIN: 1.8 g/dL — AB (ref 3.5–5.0)
ALT: 170 U/L — AB (ref 17–63)
AST: 70 U/L — AB (ref 15–41)
Alkaline Phosphatase: 77 U/L (ref 38–126)
Anion gap: 5 (ref 5–15)
BUN: 16 mg/dL (ref 6–20)
CHLORIDE: 110 mmol/L (ref 101–111)
CO2: 24 mmol/L (ref 22–32)
CREATININE: 1.39 mg/dL — AB (ref 0.61–1.24)
Calcium: 7.3 mg/dL — ABNORMAL LOW (ref 8.9–10.3)
GFR calc Af Amer: >60 mL/min (ref >60)
GFR calc non Af Amer: 56 mL/min — ABNORMAL LOW (ref >60)
Glucose, Bld: 101 mg/dL — ABNORMAL HIGH (ref 65–99)
POTASSIUM: 3.3 mmol/L — AB (ref 3.5–5.1)
SODIUM: 139 mmol/L (ref 135–145)
Total Bilirubin: 1.1 mg/dL (ref 0.3–1.2)
Total Protein: 4.3 g/dL — ABNORMAL LOW (ref 6.5–8.1)

## 2016-03-23 LAB — CULTURE, BLOOD (ROUTINE X 2)
CULTURE: NO GROWTH
Culture: NO GROWTH

## 2016-03-23 LAB — HEMOGLOBIN A1C
HEMOGLOBIN A1C: 5.8 % — AB (ref 4.8–5.6)
HEMOGLOBIN A1C: 5.7 % — AB (ref 4.8–5.6)
MEAN PLASMA GLUCOSE: 120 mg/dL
MEAN PLASMA GLUCOSE: 117 mg/dL

## 2016-03-23 LAB — CBC
HEMATOCRIT: 28.7 % — AB (ref 39.0–52.0)
HEMOGLOBIN: 9.6 g/dL — AB (ref 13.0–17.0)
MCH: 29.4 pg (ref 26.0–34.0)
MCHC: 33.4 g/dL (ref 30.0–36.0)
MCV: 88 fL (ref 78.0–100.0)
Platelets: 97 10*3/uL — ABNORMAL LOW (ref 150–400)
RBC: 3.26 MIL/uL — ABNORMAL LOW (ref 4.22–5.81)
RDW: 15.9 % — AB (ref 11.5–15.5)
WBC: 9.9 10*3/uL (ref 4.0–10.5)

## 2016-03-23 MED ORDER — POTASSIUM CHLORIDE CRYS ER 20 MEQ PO TBCR
40.0000 meq | EXTENDED_RELEASE_TABLET | Freq: Once | ORAL | Status: AC
  Administered 2016-03-23: 40 meq via ORAL
  Filled 2016-03-23: qty 2

## 2016-03-23 NOTE — Progress Notes (Signed)
PROGRESS NOTE    Scott Leonard  WUJ:[id number] DOB: July 20, 1961 DOA: 03/18/2016 PCP: No PCP Per Patient   Brief Narrative:  55 year old WM PMHx  PNA and sepsis for which he required right lobectomy   who goes to [place] for a month yearly on a mission and education trip. He did not take any prophylaxis. On the 7/28 he felt GI distress, poor appetite and intermittent fever. He attributed these symptoms to food poisoning. He continued to feel poorly over the following days. He returned home on 7/30 and his intermittent fever, diarrhea, weakness, and dizziness continued to worsen to the point he was falling at home. He went to his PCP. A CBC was drawn and smear was positive for malaria. He was sent to the ED for further evaluation where he was found to have AKI, mild lactic acidosis, and severe thrombocytopenia. PCCM was asked to admit, and he was evaluated by ID.       Subjective: Very anxious, demand to have IV removed, some bilateral ankle edema, daughter in room   Assessment & Plan:   Principal Problem:   Other severe and complicated Plasmodium falciparum malaria Active Problems:   Lactic acidosis   Thrombocytopenia (HCC)   AKI (acute kidney injury) (HCC)   Transaminitis   Expressive aphasia   Elevated bilirubin   Elevated LDH   Severe plasmodium falciparum cerebral malaria  -Medical tx per ID - very high level parasitemia at 26% initially - f/u counts signif improved , finished treatment course for malaria with coartem x3 days - HIV negative  - counseled pt on absolute need to use prophylaxis each and every time he visits endemic areas: Dr. Luciana Axe ID also reinforced that he must follow-up in trauma clinic prior to leaving the [place] in the future,  Hemolysis / Severe thrombocytopenia  Recent Labs Lab 03/19/16 0423 03/19/16 1542 03/20/16 0519 03/21/16 0423 03/23/16 0545  HGB 11.4* 11.0* 10.6* 11.0* 9.6*  -Continue to monitor hemoglobin and platelet count.    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  - LE still elevated but Improving, he reported he was checked negative for hepatitis, will get liver US to complete the work up  Hypokalemia -K Dur 50 mEq 1    Hypomag Corrected to normal   Hypophos  -Resolved    Prior RLL Lobectomy no acute issues   Mild hyperglycemia  -Check A1c: 5.7  - hypoglycemia is usually the bigger concern in severe malaria per my reading    DVT prophylaxis: SCD  Code Status: Full Family Communication: daughter present at bedside Disposition Plan: likely d/c on 8/10   Consultants:  PCCM ID  Procedures/Significant Events:  None  Cultures 8/4 blood left AC/hand NGTD 8/4 MRSA by PCR negative 8/4 blood positive Plasmodium falciparum malaria 8/5 blood positive Plasmodium falciparum malaria   Antimicrobials: Doxycycline 8/4 > 8/5 Quinidine 8/4 > 8/5   Devices    LINES / TUBES:      Continuous Infusions: . sodium chloride 125 mL/hr at 03/23/16 9562     Objective: Vitals:  03/22/16 1440 03/22/16 1456 03/22/16 2202 03/23/16 0500  BP:  114/65 119/69 116/70  Pulse:  (!) 50 (!) 51 (!) 49  Resp:  16 18 17   Temp:  98.7 F (37.1 C) 98.3 F (36.8 C) 98.1 F (36.7 C)  TempSrc:  Oral Oral Oral  SpO2:  98% 94% 95%  Weight: 75.9 kg (167 lb 6.4 oz)   76.2 kg (167 lb 15.9 oz)  Height: 5\' 9"  (1.753 m)       Intake/Output Summary (Last 24 hours) at 03/23/16 1358 Last data filed at 03/23/16 0900  Gross per 24 hour  Intake             2750 ml  Output                0 ml  Net             2750 ml    Filed Weights   03/22/16 0306 03/22/16 1440 03/23/16 0500  Weight: 76 kg (167 lb 8.8 oz) 75.9 kg (167 lb 6.4 oz) 76.2 kg (167 lb 15.9 oz)    Examination:  General: A/O 4, NAD, No acute respiratory distress Eyes: negative scleral hemorrhage, negative anisocoria, negative icterus ENT: Negative Runny nose, negative gingival bleeding, Neck:  Negative scars, masses, torticollis, lymphadenopathy, JVD Lungs: negative breath sounds right lung fields, fourth rib missing, left lung fields clear to auscultation negative wheezes or crackles Cardiovascular: Regular rate and rhythm without murmur gallop or rub normal S1 and S2 Abdomen: negative abdominal pain, nondistended, positive soft, bowel sounds, no rebound, no ascites, no appreciable mass Extremities: some bilateral ankle edema Skin: Negative rashes, lesions, ulcers Psychiatric:  Negative depression, negative anxiety, negative fatigue, negative mania  Central nervous system:  Cranial nerves II through XII intact, tongue/uvula midline, all extremities muscle strength 5/5, sensation intact throughout, negative dysarthria, negative expressive aphasia, negative receptive aphasia.  .     Data Reviewed: Care during the described time interval was provided by me .  I have reviewed this patient's available data, including medical history, events of note, physical examination, and all test results as part of my evaluation. I have personally reviewed and interpreted all radiology studies.  CBC:  Recent Labs Lab 03/18/16 1246 03/18/16 1842 03/19/16 0423 03/19/16 1542 03/20/16 0519 03/21/16 0423 03/23/16 0545  WBC 8.0 7.3 6.5 5.8 6.2 7.7 9.9  NEUTROABS 6.0 5.2 4.9 4.5 4.2  --   --   HGB 13.5 11.0* 11.4* 11.0* 10.6* 11.0* 9.6*  HCT 38.4* 32.6* 33.5* 32.0* 29.5* 32.9* 28.7*  MCV 86.1 86.7 87.0 86.7 84.5 86.1 88.0  PLT 28* 21* 20* 16* 26* 34* 97*   Basic Metabolic Panel:  Recent Labs Lab 03/19/16 0423 03/20/16 0519 03/21/16 0423  03/22/16 0453 03/23/16 0545  NA 138 136 137 137 139  K 3.5 3.5 3.4* 3.3* 3.3*  CL 106 103 107 106 110  CO2 22 24 24 24 24   GLUCOSE 128* 117* 103* 102* 101*  BUN 20 28* 31* 22* 16  CREATININE 1.24 1.69* 1.81* 1.59* 1.39*  CALCIUM 7.2* 7.0* 7.1* 7.2* 7.3*  MG 1.7 2.2  --   --  1.8  PHOS 1.5* 2.3*  --  2.9  --    GFR: Estimated Creatinine Clearance: 60 mL/min (by C-G formula based on SCr of 1.39 mg/dL). Liver Function Tests:  Recent Labs Lab 03/19/16 0423 03/20/16 0519 03/21/16 0423 03/22/16 0453 03/23/16 0545  AST 216* 346* 168* 120* 70*  ALT 309* 371* 314* 220* 170*  ALKPHOS 55 47 55 61 77  BILITOT 3.3* 2.6* 2.2* 1.4* 1.1  PROT 4.5* 4.1* 4.2* 3.8* 4.3*  ALBUMIN 2.4* 2.1* 2.0* 1.7* 1.8*   No results for input(s): LIPASE, AMYLASE in the last 168 hours. No results for input(s): AMMONIA in the last 168 hours. Coagulation Profile:  Recent Labs Lab 03/19/16 0423  INR 1.24   Cardiac Enzymes: No results for input(s): CKTOTAL, CKMB, CKMBINDEX, TROPONINI in the last 168 hours. BNP (last 3 results) No results for input(s): PROBNP in the last 8760 hours. HbA1C:  Recent Labs  03/22/16 0453 03/22/16 1145  HGBA1C 5.8* 5.7*   CBG:  Recent Labs Lab 03/19/16 2007  GLUCAP 140*   Lipid Profile:  Recent Labs  03/22/16 1145  CHOL 101  HDL <10*  LDLCALC NOT CALCULATED  TRIG 369*  CHOLHDL NOT CALCULATED   Thyroid Function Tests: No results for input(s): TSH, T4TOTAL, FREET4, T3FREE, THYROIDAB in the last 72 hours. Anemia Panel: No results for input(s): VITAMINB12, FOLATE, FERRITIN, TIBC, IRON, RETICCTPCT in the last 72 hours. Urine analysis:    Component Value Date/Time   COLORURINE AMBER (A) 03/20/2016 2254   APPEARANCEUR TURBID (A) 03/20/2016 2254   LABSPEC 1.022 03/20/2016 2254   PHURINE 5.5 03/20/2016 2254   GLUCOSEU NEGATIVE 03/20/2016 2254   HGBUR MODERATE (A) 03/20/2016 2254   BILIRUBINUR SMALL (A) 03/20/2016 2254   KETONESUR NEGATIVE 03/20/2016 2254    PROTEINUR 30 (A) 03/20/2016 2254   NITRITE POSITIVE (A) 03/20/2016 2254   LEUKOCYTESUR TRACE (A) 03/20/2016 2254   Sepsis Labs: @LABRCNTIP (procalcitonin:4,lacticidven:4)  ) Recent Results (from the past 240 hour(s))  Blood culture (routine x 2)     Status: None (Preliminary result)   Collection Time: 03/18/16  2:50 PM  Result Value Ref Range Status   Specimen Description BLOOD LEFT ANTECUBITAL  Final   Special Requests BOTTLES DRAWN AEROBIC AND ANAEROBIC  10CC  Final   Culture NO GROWTH 4 DAYS  Final   Report Status PENDING  Incomplete  Blood culture (routine x 2)     Status: None (Preliminary result)   Collection Time: 03/18/16  3:00 PM  Result Value Ref Range Status   Specimen Description BLOOD LEFT HAND  Final   Special Requests BOTTLES DRAWN AEROBIC AND ANAEROBIC  10CC  Final   Culture NO GROWTH 4 DAYS  Final   Report Status PENDING  Incomplete  MRSA PCR Screening     Status: None   Collection Time: 03/18/16  6:49 PM  Result Value Ref Range Status   MRSA by PCR NEGATIVE NEGATIVE Final    Comment:        The GeneXpert MRSA Assay (FDA approved for NASAL specimens only), is one component of a comprehensive MRSA colonization surveillance program. It is not intended to diagnose MRSA infection nor to guide or monitor treatment for MRSA infections.          Radiology Studies: No results found.      Scheduled Meds: . feeding supplement (ENSURE ENLIVE)  237 mL Oral BID BM   Continuous Infusions: . sodium chloride 125 mL/hr at 03/23/16 0627     LOS: 5 days    Time spent: 40 minutes    Xavier Munger, MD PhD [facility] Pager [phone]  If 7PM-7AM, please contact night-coverage [email or web address] Password [email or web address] 03/23/2016, 1:58 PM

## 2016-03-24 LAB — CBC
HEMATOCRIT: 29.7 % — AB (ref 39.0–52.0)
HEMOGLOBIN: 9.8 g/dL — AB (ref 13.0–17.0)
MCH: 28.9 pg (ref 26.0–34.0)
MCHC: 33 g/dL (ref 30.0–36.0)
MCV: 87.6 fL (ref 78.0–100.0)
Platelets: 154 10*3/uL (ref 150–400)
RBC: 3.39 MIL/uL — ABNORMAL LOW (ref 4.22–5.81)
RDW: 16.2 % — AB (ref 11.5–15.5)
WBC: 12.2 10*3/uL — AB (ref 4.0–10.5)

## 2016-03-24 LAB — PARASITE EXAM, BLOOD
PARASITE EXAM, BLOOD: POSITIVE — AB
Parasite Exam, Blood: POSITIVE — AB

## 2016-03-24 LAB — COMPREHENSIVE METABOLIC PANEL
ALT: 129 U/L — ABNORMAL HIGH (ref 17–63)
AST: 49 U/L — ABNORMAL HIGH (ref 15–41)
Albumin: 1.9 g/dL — ABNORMAL LOW (ref 3.5–5.0)
Alkaline Phosphatase: 75 U/L (ref 38–126)
Anion gap: 5 (ref 5–15)
BILIRUBIN TOTAL: 1 mg/dL (ref 0.3–1.2)
BUN: 11 mg/dL (ref 6–20)
CHLORIDE: 110 mmol/L (ref 101–111)
CO2: 25 mmol/L (ref 22–32)
CREATININE: 1.34 mg/dL — AB (ref 0.61–1.24)
Calcium: 7.5 mg/dL — ABNORMAL LOW (ref 8.9–10.3)
GFR, EST NON AFRICAN AMERICAN: 58 mL/min — AB (ref >60)
Glucose, Bld: 100 mg/dL — ABNORMAL HIGH (ref 65–99)
POTASSIUM: 3.7 mmol/L (ref 3.5–5.1)
Sodium: 140 mmol/L (ref 135–145)
TOTAL PROTEIN: 4.8 g/dL — AB (ref 6.5–8.1)

## 2016-03-24 LAB — MAGNESIUM: MAGNESIUM: 1.8 mg/dL (ref 1.7–2.4)

## 2016-03-24 MED ORDER — FUROSEMIDE 40 MG PO TABS
40.0000 mg | ORAL_TABLET | Freq: Once | ORAL | Status: AC
  Administered 2016-03-24: 40 mg via ORAL
  Filled 2016-03-24: qty 1

## 2016-03-24 NOTE — Discharge Summary (Signed)
Discharge Summary  Scott Leonard ZOX:[id number] DOB: August 12, 1961  PCP: No PCP Per Patient  Admit date: 03/18/2016 Discharge date: 03/24/2016  Time spent: <13mins  Recommendations for Outpatient Follow-up:  1. F/u with PMD within a week  for hospital discharge follow up, repeat cbc/bmp at follow up 2. F/u with infectious disease  Discharge Diagnoses:  Active Hospital Problems   Diagnosis Date Noted  . Other severe and complicated Plasmodium falciparum malaria 03/18/2016  . Elevated bilirubin   . Elevated LDH   . AKI (acute kidney injury) (HCC)   . Transaminitis   . Expressive aphasia   . Lactic acidosis 03/18/2016  . Thrombocytopenia (HCC) 03/18/2016    Resolved Hospital Problems   Diagnosis Date Noted Date Resolved  No resolved problems to display.    Discharge Condition: stable  Diet recommendation: regular diet  Filed Weights   03/22/16 1440 03/23/16 0500 03/24/16 0457  Weight: 75.9 kg (167 lb 6.4 oz) 76.2 kg (167 lb 15.9 oz) 78.5 kg (173 lb)    History of present illness:  REFERRING MD:  Issacs   CHIEF COMPLAINT:  Malaria   HISTORY OF PRESENT ILLNESS:   This is a pleasant 55 year old male w/ only sig medical h/o PNA and sepsis for which he required prior right lobectomy in past. Goes to [place] yearly on mission and education trip for 1 mo. Did not take any prophylaxis. On the 28th had some sort of egg salad meal which had been out for some time and shortly after that he felt some GI distress, poor appetite and intermittent fever. He attributed these symptoms to food poisoning. He continued to feel poorly over the following days. He returned home on 7/30 and his symptoms of intermittent fever, diarrhea and eventually weakness and dizziness continued to worsen to point he was falling at home. He went to his PCP. A CBC was drawn and smear was positive for malaria. He was sent to the ED for further evaluation. On eval was found to have AKI, mild Lactic acidosis and severe  thrombocytopenia. PCCM was asked to admit.   Hospital Course:  Principal Problem:   Other severe and complicated Plasmodium falciparum malaria Active Problems:   Lactic acidosis   Thrombocytopenia (HCC)   AKI (acute kidney injury) (HCC)   Transaminitis   Expressive aphasia   Elevated bilirubin   Elevated LDH   Severe plasmodium falciparum cerebral malaria  -Medical txper ID - very high level parasitemia at 26% initially - f/u counts signif improved, finished treatment course for malaria with coartem x3 days - HIV negative  - counseled pt on absolute need to use prophylaxis each and every time he visits endemic areas: Dr. Luciana Axe ID also reinforced that he must follow-up in trauma clinic prior to leaving the [place] in the future,  Hemolysis / Severe thrombocytopenia   hemoglobin and platelet count improving, repeat labs at hospital follow up  QTc prolonged on IV Quinidine  -QTc improved to 580 this morning - cont to monitor - now off quinidine should cont to improve  -Repeat EKG on 8/9, QTc 461, d/c tele  AKI - improving   Recent Labs       Lab Results  Component Value Date   CREATININE 1.39 (H) 03/23/2016   CREATININE 1.59 (H) 03/22/2016   CREATININE 1.81 (H) 03/21/2016    -s/p normal saline 155ml/hr, d/c ivf on 8/9  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   DVT prophylaxis: SCD  Code Status: Full Family Communication: daughter present at  bedside Disposition Plan: d/c on 8/10   Consultants:  PCCM ID  Procedures/Significant Events:  None  Cultures 8/4 blood left AC/hand NGTD 8/4 MRSA by PCR negative 8/4 blood positive Plasmodium falciparum malaria 8/5 blood positive Plasmodium falciparum malaria   Antimicrobials: Doxycycline 8/4 >8/5 Quinidine 8/4 >8/5 coartem on 8/6, 8/7 and 8/8   Discharge Exam: BP 122/68 (BP Location: Right Arm)   Pulse (!) 45   Temp 97.8 F (36.6 C) (Oral)   Resp 18   Ht  (1.753 m)   Wt 78.5 kg (173 lb)   SpO2 97%   BMI 25.55 kg/m   General: NAD Cardiovascular: RRR Respiratory: diminished breath sound right lung fields consistent with prior history of lobectomy, left lung clear  Discharge Instructions You were cared for by a hospitalist during your hospital stay. If you have any questions about your discharge medications or the care you received while you were in the hospital after you are discharged, you can call the unit and asked to speak with the hospitalist on call if the hospitalist that took care of you is not available. Once you are discharged, your primary care physician will handle any further medical issues. Please note that NO REFILLS for any discharge medications will be authorized once you are discharged, as it is imperative that you return to your primary care physician (or establish a relationship with a primary care physician if you do not have one) for your aftercare needs so that they can reassess your need for medications and monitor your lab values.  Discharge Instructions    Diet general    Complete by:  As directed   Increase activity slowly    Complete by:  As directed       Medication List    You have not been prescribed any medications.    No Known Allergies Follow-up Information    COMER, ROBERT, MD In 2 months.   Specialty:  Infectious Diseases Why:  travel clinic , prior next mission trip Contact information: [address] [place] [address] [phone]        Selinda Flavin, MD In 1 week.   Specialty:  Family Medicine Why:  hospital discharge follow up, repeat cbc/cmp at follow up./// Called and left message with doctor office for them to call patient at home with an appointment  Contact information: [address] [place] [address] [phone]            The results of significant diagnostics from this hospitalization (including imaging, microbiology, ancillary and laboratory) are listed below for reference.    Significant Diagnostic Studies: Dg Chest 2 View  Result Date: 03/18/2016 CLINICAL DATA:  55 year old male just returned from [place] with fever and weakness. Suspected malaria. Altered mental status during imaging for this exam. Initial encounter. EXAM: CHEST  2 VIEW COMPARISON:  Report of chest radiographs 04/04/2003 (no images available). FINDINGS: Postoperative changes to the right hemi thorax  with chronic rib deformities and suspected chronic pleural scarring along the right hemidiaphragm and in the posterior hemi thorax. Associated mild volume reduction in that lung. Mediastinal contours remain within normal limits. Visualized tracheal air column is within normal limits. No pneumothorax, pulmonary edema, definite pleural effusion, or definite acute pulmonary opacity. There is also a chronic right clavicle deformity. No acute osseous abnormality identified. IMPRESSION: Postoperative changes in the right hemi thorax. No definite acute cardiopulmonary abnormality. Electronically Signed   By: Odessa FlemingH  Hall M.D.   On: 03/18/2016 14:23   Ct Head Wo Contrast  Result Date: 03/18/2016 CLINICAL DATA:  55 year old male with confusion and gait instability. Initial encounter. EXAM: CT HEAD WITHOUT CONTRAST TECHNIQUE: Contiguous axial images were obtained from the base of the skull through the vertex without intravenous contrast. COMPARISON:  Head CT without contrast 08/10/2002 FINDINGS:  Chronic appearing opacification of the visible right maxillary sinus. Resolved bilateral mastoid effusions and other paranasal sinus opacification seen in 2003. No acute osseous abnormality identified. Visualized orbit soft tissues are within normal limits. Visualized scalp soft tissues are within normal limits. Mild Calcified atherosclerosis at the skull base. Cerebral volume remains normal. No midline shift, ventriculomegaly, mass effect, evidence of mass lesion, intracranial hemorrhage or evidence of cortically based acute infarction. Subtle inferior right frontal gyrus encephalomalacia corresponding to an area of cerebral contusion in 2003 (series 2, image 9). Elsewhere Gray-white matter differentiation is within normal limits throughout the brain. No suspicious intracranial vascular hyperdensity. IMPRESSION: 1. No acute intracranial abnormality. Negative noncontrast CT appearance of the brain aside from sequelae of 2003 mild right inferior frontal gyrus cerebral contusion. 2. Chronic right maxillary sinus opacification but other paranasal sinus and mastoid opacification seen in 2003 is resolved. Electronically Signed   By: Odessa FlemingH  Hall M.D.   On: 03/18/2016 14:12   [place] Abdomen Limited  Result Date: 03/23/2016 CLINICAL DATA:  Elevated LFTs.  Malaria.  Sepsis, thrombocytopenia. EXAM: US ABDOMEN LIMITED - RIGHT UPPER QUADRANT COMPARISON:  None. FINDINGS: Gallbladder: Sludge in the gallbladder. No calculi. Gallbladder wall thickness upper limits normal. Trace pericholecystic fluid. Sonographer describes no sonographic Murphy's sign. Common bile duct: Diameter: 5.6 mm Liver: No focal lesion identified. Within normal limits in parenchymal echogenicity. There is a small amount of periapical hepatic and perisplenic ascites identified. IMPRESSION: 1. Small amount of upper abdominal ascites. 2. No cholelithiasis or other ultrasound evidence to suggest cholecystitis or biliary obstruction. Electronically Signed   By: Corlis Leak   Hassell M.D.   On: 03/23/2016 21:16    Microbiology: Recent Results (from the past 240 hour(s))  Blood culture (routine x 2)     Status: None   Collection Time: 03/18/16  2:50 PM  Result Value Ref Range Status   Specimen Description BLOOD LEFT ANTECUBITAL  Final   Special Requests BOTTLES DRAWN AEROBIC AND ANAEROBIC  10CC  Final   Culture NO GROWTH 5 DAYS  Final   Report Status 03/23/2016 FINAL  Final  Blood culture (routine x 2)     Status: None   Collection Time: 03/18/16  3:00 PM  Result Value Ref Range Status   Specimen Description BLOOD LEFT HAND  Final   Special Requests BOTTLES DRAWN AEROBIC AND ANAEROBIC  10CC  Final   Culture NO GROWTH 5 DAYS  Final   Report Status 03/23/2016 FINAL  Final  MRSA PCR Screening     Status: None   Collection Time: 03/18/16  6:49 PM  Result Value Ref Range Status   MRSA by PCR NEGATIVE NEGATIVE  Final    Comment:        The GeneXpert MRSA Assay (FDA approved for NASAL specimens only), is one component of a comprehensive MRSA colonization surveillance program. It is not intended to diagnose MRSA infection nor to guide or monitor treatment for MRSA infections.      Labs: Basic Metabolic Panel:  Recent Labs Lab 03/19/16 0423 03/20/16 0519 03/21/16 0423 03/22/16 0453 03/23/16 0545 03/24/16 0544  NA 138 136 137 137 139 140  K 3.5 3.5 3.4* 3.3* 3.3* 3.7  CL 106 103 107 106 110 110  CO2 22 24 24 24 24 25   GLUCOSE 128* 117* 103* 102* 101* 100*  BUN 20 28* 31* 22* 16 11  CREATININE 1.24 1.69* 1.81* 1.59* 1.39* 1.34*  CALCIUM 7.2* 7.0* 7.1* 7.2* 7.3* 7.5*  MG 1.7 2.2  --   --  1.8 1.8  PHOS 1.5* 2.3*  --  2.9  --   --    Liver Function Tests:  Recent Labs Lab 03/20/16 0519 03/21/16 0423 03/22/16 0453 03/23/16 0545 03/24/16 0544  AST 346* 168* 120* 70* 49*  ALT 371* 314* 220* 170* 129*  ALKPHOS 47 55 61 77 75  BILITOT 2.6* 2.2* 1.4* 1.1 1.0  PROT 4.1* 4.2* 3.8* 4.3* 4.8*  ALBUMIN 2.1* 2.0* 1.7* 1.8* 1.9*   No results  for input(s): LIPASE, AMYLASE in the last 168 hours. No results for input(s): AMMONIA in the last 168 hours. CBC:  Recent Labs Lab 03/18/16 1246 03/18/16 1842 03/19/16 0423 03/19/16 1542 03/20/16 0519 03/21/16 0423 03/23/16 0545 03/24/16 0544  WBC 8.0 7.3 6.5 5.8 6.2 7.7 9.9 12.2*  NEUTROABS 6.0 5.2 4.9 4.5 4.2  --   --   --   HGB 13.5 11.0* 11.4* 11.0* 10.6* 11.0* 9.6* 9.8*  HCT 38.4* 32.6* 33.5* 32.0* 29.5* 32.9* 28.7* 29.7*  MCV 86.1 86.7 87.0 86.7 84.5 86.1 88.0 87.6  PLT 28* 21* 20* 16* 26* 34* 97* 154   Cardiac Enzymes: No results for input(s): CKTOTAL, CKMB, CKMBINDEX, TROPONINI in the last 168 hours. BNP: BNP (last 3 results) No results for input(s): BNP in the last 8760 hours.  ProBNP (last 3 results) No results for input(s): PROBNP in the last 8760 hours.  CBG:  Recent Labs Lab 03/19/16 2007  GLUCAP 140*       SignedAlbertine Grates MD, PhD  [facility] 03/24/2016, 11:20 AM

## 2016-03-24 NOTE — Care Management Note (Signed)
dDCase Management Note  Patient Details  Name: Scott Leonard MRN: [id number] Date of Birth: Oct 31, 1960  Subjective/Objective:          Admitted with Malaria/ AKI.          Action/Plan: Discharge to home today.   Expected Discharge Date:  03/23/16               Expected Discharge Plan:  Home/Self Care  In-House Referral:     Discharge planning Services  CM Consult  PoStatus of Service:  Completed, signed off  If discussed at Long Length of Stay Meetings, dates discussed:    Additional Comments:  [name], [name], RN 03/24/2016, 11:29 AM

## 2019-10-20 ENCOUNTER — Ambulatory Visit: Payer: BC Managed Care – PPO | Attending: Internal Medicine

## 2019-10-20 DIAGNOSIS — Z23 Encounter for immunization: Secondary | ICD-10-CM

## 2019-10-20 NOTE — Progress Notes (Signed)
   Covid-19 Vaccination Clinic  Name:  DRAYLEN LOBUE    MRN: [id number] DOB: 10-26-1960  10/20/2019  Mr. Duvall was observed post Covid-19 immunization for 15 minutes without incident. He was provided with Vaccine Information Sheet and instruction to access the V-Safe system.   Mr. Sanmiguel was instructed to call 911 with any severe reactions post vaccine: Marland Kitchen Difficulty breathing  . Swelling of face and throat  . A fast heartbeat  . A bad rash all over body  . Dizziness and weakness   Immunizations Administered    Name Date Dose VIS Date Route   Pfizer COVID-19 Vaccine 10/20/2019 11:06 AM 0.3 mL 07/26/2019 Intramuscular   Manufacturer: [employer], [employer]   Lot: [id number]   NDC: [address]-9

## 2019-11-10 ENCOUNTER — Ambulatory Visit: Payer: BC Managed Care – PPO | Attending: Internal Medicine

## 2019-11-10 DIAGNOSIS — Z23 Encounter for immunization: Secondary | ICD-10-CM

## 2019-11-10 NOTE — Progress Notes (Signed)
   Covid-19 Vaccination Clinic  Name:  Scott Leonard    MRN: [id number] DOB: 1961/02/16  11/10/2019  [name] was observed post Covid-19 immunization for 15 minutes without incident. He was provided with Vaccine Information Sheet and instruction to access the V-Safe system.   [name] was instructed to call 911 with any severe reactions post vaccine: Marland Kitchen Difficulty breathing  . Swelling of face and throat  . A fast heartbeat  . A bad rash all over body  . Dizziness and weakness   Immunizations Administered    Name Date Dose VIS Date Route   Pfizer COVID-19 Vaccine 11/10/2019 12:58 PM 0.3 mL 07/26/2019 Intramuscular   Manufacturer: [employer], [employer]   Lot: [id number]   NDC: [address]-5
